# Patient Record
Sex: Male | Born: 1965
Health system: Southern US, Community
[De-identification: ages and names within clinical notes are randomized; demographics above are authoritative.]

## PROBLEM LIST (undated history)

## (undated) ENCOUNTER — Emergency Department (HOSPITAL_BASED_OUTPATIENT_CLINIC_OR_DEPARTMENT_OTHER): Payer: Managed Care, Other (non HMO) | Source: Home / Self Care

## (undated) DIAGNOSIS — R7303 Prediabetes: Secondary | ICD-10-CM

## (undated) DIAGNOSIS — Z86010 Personal history of colonic polyps: Secondary | ICD-10-CM

## (undated) DIAGNOSIS — I10 Essential (primary) hypertension: Secondary | ICD-10-CM

## (undated) DIAGNOSIS — F329 Major depressive disorder, single episode, unspecified: Secondary | ICD-10-CM

## (undated) DIAGNOSIS — K279 Peptic ulcer, site unspecified, unspecified as acute or chronic, without hemorrhage or perforation: Secondary | ICD-10-CM

## (undated) DIAGNOSIS — F3130 Bipolar disorder, current episode depressed, mild or moderate severity, unspecified: Secondary | ICD-10-CM

## (undated) DIAGNOSIS — K219 Gastro-esophageal reflux disease without esophagitis: Secondary | ICD-10-CM

## (undated) DIAGNOSIS — Z860101 Personal history of adenomatous and serrated colon polyps: Secondary | ICD-10-CM

## (undated) DIAGNOSIS — I639 Cerebral infarction, unspecified: Secondary | ICD-10-CM

## (undated) DIAGNOSIS — F411 Generalized anxiety disorder: Secondary | ICD-10-CM

## (undated) DIAGNOSIS — I63532 Cerebral infarction due to unspecified occlusion or stenosis of left posterior cerebral artery: Secondary | ICD-10-CM

## (undated) HISTORY — DX: Cerebral infarction due to unspecified occlusion or stenosis of left posterior cerebral artery: I63.532

## (undated) HISTORY — DX: Essential (primary) hypertension: I10

## (undated) HISTORY — DX: Bipolar disorder, current episode depressed, mild or moderate severity, unspecified: F31.30

## (undated) HISTORY — DX: Major depressive disorder, single episode, unspecified: F32.9

## (undated) HISTORY — DX: Prediabetes: R73.03

## (undated) HISTORY — DX: Cerebral infarction, unspecified: I63.9

## (undated) HISTORY — DX: Personal history of colonic polyps: Z86.010

## (undated) HISTORY — DX: Generalized anxiety disorder: F41.1

## (undated) HISTORY — DX: Personal history of adenomatous and serrated colon polyps: Z86.0101

## (undated) HISTORY — DX: Peptic ulcer, site unspecified, unspecified as acute or chronic, without hemorrhage or perforation: K27.9

## (undated) HISTORY — DX: Gastro-esophageal reflux disease without esophagitis: K21.9

---
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
## 1998-05-24 HISTORY — PX: FLEXIBLE SIGMOIDOSCOPY: SHX1649

## 2010-05-24 HISTORY — PX: VASECTOMY: SHX75

## 2010-05-24 HISTORY — PX: ESOPHAGOGASTRODUODENOSCOPY: SHX1529

## 2010-09-21 ENCOUNTER — Other Ambulatory Visit: Payer: Self-pay | Admitting: Gastroenterology

## 2010-09-25 ENCOUNTER — Ambulatory Visit
Admission: RE | Admit: 2010-09-25 | Discharge: 2010-09-25 | Disposition: A | Discharge status: Home / Self Care | Payer: Managed Care, Other (non HMO) | Source: Ambulatory Visit | Attending: Gastroenterology | Admitting: Gastroenterology

## 2010-09-25 ENCOUNTER — Other Ambulatory Visit: Payer: Self-pay

## 2011-02-23 ENCOUNTER — Other Ambulatory Visit: Payer: Self-pay | Admitting: Gastroenterology

## 2011-02-23 ENCOUNTER — Ambulatory Visit (HOSPITAL_COMMUNITY)
Admission: RE | Admit: 2011-02-23 | Discharge: 2011-02-23 | Disposition: A | Discharge status: Home / Self Care | Payer: Managed Care, Other (non HMO) | Source: Ambulatory Visit | Attending: Gastroenterology | Admitting: Gastroenterology

## 2011-02-23 DIAGNOSIS — R1310 Dysphagia, unspecified: Secondary | ICD-10-CM | POA: Insufficient documentation

## 2011-02-23 DIAGNOSIS — K222 Esophageal obstruction: Secondary | ICD-10-CM | POA: Insufficient documentation

## 2011-02-23 DIAGNOSIS — K219 Gastro-esophageal reflux disease without esophagitis: Secondary | ICD-10-CM | POA: Insufficient documentation

## 2011-11-02 ENCOUNTER — Other Ambulatory Visit: Payer: Self-pay | Admitting: Family Medicine

## 2011-11-02 ENCOUNTER — Ambulatory Visit
Admission: RE | Admit: 2011-11-02 | Discharge: 2011-11-02 | Disposition: A | Discharge status: Home / Self Care | Payer: Managed Care, Other (non HMO) | Source: Ambulatory Visit | Attending: Family Medicine | Admitting: Family Medicine

## 2011-11-02 DIAGNOSIS — M25569 Pain in unspecified knee: Secondary | ICD-10-CM

## 2013-12-22 ENCOUNTER — Encounter: Payer: Self-pay | Admitting: *Deleted

## 2016-07-09 DIAGNOSIS — Z131 Encounter for screening for diabetes mellitus: Secondary | ICD-10-CM | POA: Diagnosis not present

## 2016-07-09 DIAGNOSIS — Z0000 Encounter for general adult medical examination without abnormal findings: Secondary | ICD-10-CM | POA: Diagnosis not present

## 2016-07-09 DIAGNOSIS — Z13220 Encounter for screening for lipoid disorders: Secondary | ICD-10-CM | POA: Diagnosis not present

## 2016-07-09 DIAGNOSIS — R079 Chest pain, unspecified: Secondary | ICD-10-CM | POA: Diagnosis not present

## 2017-01-04 DIAGNOSIS — R0602 Shortness of breath: Secondary | ICD-10-CM | POA: Diagnosis not present

## 2017-07-13 DIAGNOSIS — Z79899 Other long term (current) drug therapy: Secondary | ICD-10-CM | POA: Diagnosis not present

## 2017-07-13 DIAGNOSIS — Z1329 Encounter for screening for other suspected endocrine disorder: Secondary | ICD-10-CM | POA: Diagnosis not present

## 2017-07-13 DIAGNOSIS — K219 Gastro-esophageal reflux disease without esophagitis: Secondary | ICD-10-CM | POA: Diagnosis not present

## 2017-07-13 DIAGNOSIS — J3089 Other allergic rhinitis: Secondary | ICD-10-CM | POA: Diagnosis not present

## 2017-07-13 DIAGNOSIS — Z13220 Encounter for screening for lipoid disorders: Secondary | ICD-10-CM | POA: Diagnosis not present

## 2017-07-13 DIAGNOSIS — Z0000 Encounter for general adult medical examination without abnormal findings: Secondary | ICD-10-CM | POA: Diagnosis not present

## 2017-07-13 DIAGNOSIS — M19042 Primary osteoarthritis, left hand: Secondary | ICD-10-CM | POA: Diagnosis not present

## 2017-09-26 DIAGNOSIS — R200 Anesthesia of skin: Secondary | ICD-10-CM | POA: Diagnosis not present

## 2018-01-17 DIAGNOSIS — R200 Anesthesia of skin: Secondary | ICD-10-CM | POA: Diagnosis not present

## 2018-02-15 ENCOUNTER — Ambulatory Visit: Payer: 59 | Admitting: Neurology

## 2018-02-15 ENCOUNTER — Encounter: Payer: Self-pay | Admitting: Neurology

## 2018-02-15 ENCOUNTER — Ambulatory Visit (INDEPENDENT_AMBULATORY_CARE_PROVIDER_SITE_OTHER): Payer: 59 | Admitting: Neurology

## 2018-02-15 DIAGNOSIS — R202 Paresthesia of skin: Secondary | ICD-10-CM

## 2018-02-15 NOTE — Progress Notes (Signed)
Please refer to EMG and nerve conduction procedure note.  

## 2018-02-15 NOTE — Procedures (Signed)
     HISTORY:  Randall EvenerRichard Cortez is a 52 year old gentleman with a history of migratory sensory alterations that mainly involving the left anterolateral aspect of the thigh, he may have some occasional episodes involving the posterior upper arm on the right.  The episodes will last 5 to 10 minutes and fully clear, most of the time he feels normal, there is no history of neck pain or low back pain or any weakness.  NERVE CONDUCTION STUDIES:  Nerve conduction studies were performed on the right upper extremity. The distal motor latencies and motor amplitudes for the median, radial and ulnar nerves were within normal limits. The nerve conduction velocities for these nerves were also normal. The sensory latencies for the median, radial, and ulnar nerves were normal. The F wave latency for the ulnar nerve was within normal limits.  Nerve conduction studies were performed on the left lower extremity. The distal motor latencies and motor amplitudes for the peroneal and posterior tibial nerves were within normal limits. The nerve conduction velocities for these nerves were also normal. The sensory latencies for the peroneal and sural nerves were within normal limits. The F wave latency for the posterior tibial nerve was within normal limits.   EMG STUDIES:  EMG study was performed on the left lower extremity:  The tibialis anterior muscle reveals 2 to 4K motor units with full recruitment. No fibrillations or positive waves were seen. The peroneus tertius muscle reveals 2 to 4K motor units with full recruitment. No fibrillations or positive waves were seen. The medial gastrocnemius muscle reveals 1 to 3K motor units with full recruitment. No fibrillations or positive waves were seen. The vastus lateralis muscle reveals 2 to 4K motor units with full recruitment. No fibrillations or positive waves were seen. The iliopsoas muscle reveals 2 to 4K motor units with full recruitment. No fibrillations or positive  waves were seen. The biceps femoris muscle (long head) reveals 2 to 4K motor units with full recruitment. No fibrillations or positive waves were seen. The lumbosacral paraspinal muscles were tested at 3 levels, and revealed no abnormalities of insertional activity at all 3 levels tested. There was poor relaxation.   IMPRESSION:  Nerve conduction studies involving the right upper extremity and left lower extremity were within normal limits.  No evidence of a neuropathy is seen.  EMG evaluation of the left lower extremity is unremarkable, no evidence of a lumbosacral radiculopathy is seen.  Randall Cortez. Randall Graydon Fofana MD 02/15/2018 3:00 PM  [facility] [address] [place], [place] [id number]  Phone [phone] Fax [phone]

## 2018-07-15 DIAGNOSIS — R05 Cough: Secondary | ICD-10-CM | POA: Diagnosis not present

## 2018-07-15 DIAGNOSIS — R0982 Postnasal drip: Secondary | ICD-10-CM | POA: Diagnosis not present

## 2018-07-20 DIAGNOSIS — N509 Disorder of male genital organs, unspecified: Secondary | ICD-10-CM | POA: Diagnosis not present

## 2018-07-20 DIAGNOSIS — Z0000 Encounter for general adult medical examination without abnormal findings: Secondary | ICD-10-CM | POA: Diagnosis not present

## 2018-07-20 DIAGNOSIS — J40 Bronchitis, not specified as acute or chronic: Secondary | ICD-10-CM | POA: Diagnosis not present

## 2018-07-21 DIAGNOSIS — Z0000 Encounter for general adult medical examination without abnormal findings: Secondary | ICD-10-CM | POA: Diagnosis not present

## 2018-07-21 DIAGNOSIS — Z13220 Encounter for screening for lipoid disorders: Secondary | ICD-10-CM | POA: Diagnosis not present

## 2018-08-03 DIAGNOSIS — J069 Acute upper respiratory infection, unspecified: Secondary | ICD-10-CM | POA: Diagnosis not present

## 2019-07-03 NOTE — Progress Notes (Signed)
Cardiology Office Note:    Date:  07/05/2019   ID:  Randall Cortez, DOB 1966-01-24, MRN [id number]  PCP:  Patient, No Pcp Per  Cardiologist:  No primary care provider on file.  Electrophysiologist:  None   Referring MD: Carolee Rota, NP   Reason for visit: chest pain and SOB  History of Present Illness:    Randall Cortez is a 54 y.o. male with a hx of acute ischemic left-sided posterior circulation stroke, hypertension, major depressive disorder and bipolar disorder, hyperlipidemia who is working for [employer] [place] and is a very pleasant patient coming for evaluation of chest pain that he experienced last week.  The patient states that prior to Covid he has been very active going to gym 5 times a week and was running on a treadmill for about an hour.  With that he was completely asymptomatic.  With gym closures he stopped exercising gained about 30 pounds and has noticed mild dyspnea on exertion.  He developed pressure-like chest pain last week while he was at rest, that was not associated with dyspnea dizziness or syncope.  He otherwise denies any orthopnea proximal nocturnal dyspnea no lower extremity edema.  The patient has never smoked, he has a family history of premature coronary artery disease in his mom who is a Marine scientist and had a myocardial infarction in her 107s.  She is still alive.  Past Medical History:  Diagnosis Date  . Acute ischemic multifocal posterior circulation stroke involving left-sided vessel ([place])   . Bipolar disorder current episode depressed ([place])   . Cryptogenic stroke ([place])   . GAD (generalized anxiety disorder)   . GERD (gastroesophageal reflux disease)   . History of adenomatous polyp of colon   . HTN (hypertension)   . MDD (major depressive disorder)   . Peptic ulcer   . Prediabetes     Past Surgical History:  Procedure Laterality Date  . APPENDECTOMY  1984  . ESOPHAGOGASTRODUODENOSCOPY  2012   Balloon  . FLEXIBLE SIGMOIDOSCOPY  2000   EDG in  [place]  . sweat gland  1985   I & D on right axillae   . VASECTOMY  2012    Current Medications: Current Meds  Medication Sig  . albuterol (VENTOLIN HFA) 108 (90 Base) MCG/ACT inhaler Inhale 2 puffs into the lungs every 4 (four) hours as needed.  Marland Kitchen atorvastatin (LIPITOR) 10 MG tablet Take 10 mg by mouth daily.  . Multiple Vitamin (MULTIVITAMIN) capsule Take 1 capsule by mouth daily.  . pantoprazole (PROTONIX) 20 MG tablet Take 20 mg by mouth daily.     Allergies:   Patient has no known allergies.   Social History   Socioeconomic History  . Marital status: Married    Spouse name: Not on file  . Number of children: Not on file  . Years of education: Not on file  . Highest education level: Not on file  Occupational History  . Not on file  Tobacco Use  . Smoking status: Never Smoker  . Smokeless tobacco: Never Used  Substance and Sexual Activity  . Alcohol use: Yes    Comment: 4 glasses of wine per month  . Drug use: No  . Sexual activity: Not on file  Other Topics Concern  . Not on file  Social History Narrative   Tobacco Use Cigarettes: Never Smoked   Alcohol: Yes, 4 glasses of wine per month   No recreational drug Use   Diet: Eating better now, not as  much fast food   Exercise: None   Occupation: Employed, Theme park manager   Marital Status: Married   Children: 3   Firearms: No   Therapist, art Use: Yes   Automotive engineer use: Yes         Social Determinants of Radio broadcast assistant Strain:   . Difficulty of Paying Living Expenses: Not on file  Food Insecurity:   . Worried About Charity fundraiser in the Last Year: Not on file  . Ran Out of Food in the Last Year: Not on file  Transportation Needs:   . Lack of Transportation (Medical): Not on file  . Lack of Transportation (Non-Medical): Not on file  Physical Activity:   . Days of Exercise per Week: Not on file  . Minutes of Exercise per Session: Not on file  Stress:   . Feeling of  Stress : Not on file  Social Connections:   . Frequency of Communication with Friends and Family: Not on file  . Frequency of Social Gatherings with Friends and Family: Not on file  . Attends Religious Services: Not on file  . Active Member of Clubs or Organizations: Not on file  . Attends Archivist Meetings: Not on file  . Marital Status: Not on file     Family History: The patient's family history includes CVA in his maternal grandmother; Diabetes Mellitus I in his paternal grandmother; Glaucoma in his mother; Heart attack (age of onset: 96) in his mother.  ROS:   Please see the history of present illness.    All other systems reviewed and are negative.  EKGs/Labs/Other Studies Reviewed:    The following studies were reviewed today:  EKG:  EKG is ordered today.  The ekg ordered today demonstrates normal sinus rhythm, LVH, otherwise normal EKG.  Recent Labs: No results found for requested labs within last 8760 hours.  Recent Lipid Panel No results found for: CHOL, TRIG, HDL, CHOLHDL, VLDL, LDLCALC, LDLDIRECT  Physical Exam:    VS:  BP 126/84   Pulse 61   Ht 6' 1" (1.854 m)   Wt 252 lb 6.4 oz (114.5 kg)   SpO2 96%   BMI 33.30 kg/m     Wt Readings from Last 3 Encounters:  07/05/19 252 lb 6.4 oz (114.5 kg)    GEN: Well nourished, well developed in no acute distress HEENT: Normal NECK: No JVD; No carotid bruits LYMPHATICS: No lymphadenopathy CARDIAC: RRR, no murmurs, rubs, gallops RESPIRATORY:  Clear to auscultation without rales, wheezing or rhonchi  ABDOMEN: Soft, non-tender, non-distended MUSCULOSKELETAL:  No edema; No deformity  SKIN: Warm and dry NEUROLOGIC:  Alert and oriented x 3 PSYCHIATRIC:  Normal affect    ASSESSMENT:    1. Hyperlipidemia, unspecified hyperlipidemia type   2. Precordial pain   3. Morbid obesity ([place])    PLAN:    In order of problems listed above:  1. Chest pain, with some typical and atypical features however  significant risk factors including hyperlipidemia, hypertension, prior history of stroke, family history of premature coronary artery disease, current physical inactivity and obesity.  Will obtain coronary CTA to further evaluate and then tailor his therapy based on his results.   Medication Adjustments/Labs and Tests Ordered: Current medicines are reviewed at length with the patient today.  Concerns regarding medicines are outlined above.  Orders Placed This Encounter  Procedures  . CT CORONARY MORPH W/CTA COR W/SCORE W/CA W/CM &/OR WO/CM  . CT CORONARY FRACTIONAL FLOW  RESERVE DATA PREP  . CT CORONARY FRACTIONAL FLOW RESERVE FLUID ANALYSIS  . Comp Met (CMET)  . Lipid Profile  . EKG 12-Lead   No orders of the defined types were placed in this encounter.   Patient Instructions  Medication Instructions:   Your physician recommends that you continue on your current medications as directed. Please refer to the Current Medication list given to you today.  *If you need a refill on your cardiac medications before your next appointment, please call your pharmacy*   Lab Work:  TODAY--CMET AND LIPIDS  If you have labs (blood work) drawn today and your tests are completely normal, you will receive your results only by: Marland Kitchen MyChart Message (if you have MyChart) OR . A paper copy in the mail If you have any lab test that is abnormal or we need to change your treatment, we will call you to review the results.   Testing/Procedures:  Your cardiac CT will be scheduled at one of the below locations:   [facility] [address] [place], [place] [address] [phone]  If scheduled at [facility], please arrive at the [facility] main entrance of [facility] 30 minutes prior to test start time. Proceed to the [facility] Radiology Department (first floor) to check-in and test prep.  Please follow these instructions carefully (unless otherwise directed):  On  the Night Before the Test: . Be sure to Drink plenty of water. . Do not consume any caffeinated/decaffeinated beverages or chocolate 12 hours prior to your test. . Do not take any antihistamines 12 hours prior to your test.  On the Day of the Test: . Drink plenty of water. Do not drink any water within one hour of the test. . Do not eat any food 4 hours prior to the test. . You may take your regular medications prior to the test.   After the Test: . Drink plenty of water. . After receiving IV contrast, you may experience a mild flushed feeling. This is normal. . On occasion, you may experience a mild rash up to 24 hours after the test. This is not dangerous. If this occurs, you can take Benadryl 25 mg and increase your fluid intake. . If you experience trouble breathing, this can be serious. If it is severe call 911 IMMEDIATELY. If it is mild, please call our office.  Once we have confirmed authorization from your insurance company, we will call you to set up a date and time for your test.   For non-scheduling related questions, please contact the cardiac imaging nurse navigator should you have any questions/concerns: Marchia Bond, RN Navigator Cardiac Imaging [facility] [phone] mobile    Follow-Up: At [facility], you and your health needs are our priority.  As part of our continuing mission to provide you with exceptional heart care, we have created designated Provider Care Teams.  These Care Teams include your primary Cardiologist (physician) and Advanced Practice Providers (APPs -  Physician Assistants and Nurse Practitioners) who all work together to provide you with the care you need, when you need it.  Your next appointment:   3 month(s)  The format for your next appointment:   In Person  Provider:   Ena Dawley, MD       Signed, Ena Dawley, MD  07/05/2019 11:59 AM    [place]

## 2019-07-05 ENCOUNTER — Ambulatory Visit: Payer: 59 | Admitting: Cardiology

## 2019-07-05 ENCOUNTER — Encounter: Payer: Self-pay | Admitting: Cardiology

## 2019-07-05 ENCOUNTER — Other Ambulatory Visit: Payer: Self-pay

## 2019-07-05 VITALS — BP 126/84 | HR 61 | Ht 73.0 in | Wt 252.4 lb

## 2019-07-05 DIAGNOSIS — R072 Precordial pain: Secondary | ICD-10-CM | POA: Diagnosis not present

## 2019-07-05 DIAGNOSIS — E785 Hyperlipidemia, unspecified: Secondary | ICD-10-CM

## 2019-07-05 NOTE — Patient Instructions (Signed)
Medication Instructions:   Your physician recommends that you continue on your current medications as directed. Please refer to the Current Medication list given to you today.  *If you need a refill on your cardiac medications before your next appointment, please call your pharmacy*   Lab Work:  TODAY--CMET AND LIPIDS  If you have labs (blood work) drawn today and your tests are completely normal, you will receive your results only by: Marland Kitchen MyChart Message (if you have MyChart) OR . A paper copy in the mail If you have any lab test that is abnormal or we need to change your treatment, we will call you to review the results.   Testing/Procedures:  Your cardiac CT will be scheduled at one of the below locations:   [facility] [address] [place], [place] [address] [phone]  If scheduled at [facility], please arrive at the [facility] main entrance of [facility] 30 minutes prior to test start time. Proceed to the [facility] Radiology Department (first floor) to check-in and test prep.  Please follow these instructions carefully (unless otherwise directed):  On the Night Before the Test: . Be sure to Drink plenty of water. . Do not consume any caffeinated/decaffeinated beverages or chocolate 12 hours prior to your test. . Do not take any antihistamines 12 hours prior to your test.  On the Day of the Test: . Drink plenty of water. Do not drink any water within one hour of the test. . Do not eat any food 4 hours prior to the test. . You may take your regular medications prior to the test.   After the Test: . Drink plenty of water. . After receiving IV contrast, you may experience a mild flushed feeling. This is normal. . On occasion, you may experience a mild rash up to 24 hours after the test. This is not dangerous. If this occurs, you can take Benadryl 25 mg and increase your fluid intake. . If you experience trouble breathing, this can be  serious. If it is severe call 911 IMMEDIATELY. If it is mild, please call our office.  Once we have confirmed authorization from your insurance company, we will call you to set up a date and time for your test.   For non-scheduling related questions, please contact the cardiac imaging nurse navigator should you have any questions/concerns: Rockwell Alexandria, RN Navigator Cardiac Imaging [facility] [phone] mobile    Follow-Up: At [facility], you and your health needs are our priority.  As part of our continuing mission to provide you with exceptional heart care, we have created designated Provider Care Teams.  These Care Teams include your primary Cardiologist (physician) and Advanced Practice Providers (APPs -  Physician Assistants and Nurse Practitioners) who all work together to provide you with the care you need, when you need it.  Your next appointment:   3 month(s)  The format for your next appointment:   In Person  Provider:   Tobias Alexander, MD

## 2019-07-06 LAB — LIPID PANEL
Chol/HDL Ratio: 4.1 ratio (ref 0.0–5.0)
Cholesterol, Total: 153 mg/dL (ref 100–199)
HDL: 37 mg/dL — ABNORMAL LOW (ref >39)
LDL Chol Calc (NIH): 81 mg/dL (ref 0–99)
Triglycerides: 211 mg/dL — ABNORMAL HIGH (ref 0–149)
VLDL Cholesterol Cal: 35 mg/dL (ref 5–40)

## 2019-07-06 LAB — COMPREHENSIVE METABOLIC PANEL
ALT: 22 IU/L (ref 0–44)
AST: 17 IU/L (ref 0–40)
Albumin/Globulin Ratio: 1.5 (ref 1.2–2.2)
Albumin: 4.4 g/dL (ref 3.8–4.9)
Alkaline Phosphatase: 90 IU/L (ref 39–117)
BUN/Creatinine Ratio: 10 (ref 9–20)
BUN: 13 mg/dL (ref 6–24)
Bilirubin Total: 0.5 mg/dL (ref 0.0–1.2)
CO2: 25 mmol/L (ref 20–29)
Calcium: 9.6 mg/dL (ref 8.7–10.2)
Chloride: 104 mmol/L (ref 96–106)
Creatinine, Ser: 1.33 mg/dL — ABNORMAL HIGH (ref 0.76–1.27)
GFR calc Af Amer: 71 mL/min/{1.73_m2} (ref >59)
GFR calc non Af Amer: 61 mL/min/{1.73_m2} (ref >59)
Globulin, Total: 3 g/dL (ref 1.5–4.5)
Glucose: 82 mg/dL (ref 65–99)
Potassium: 4.5 mmol/L (ref 3.5–5.2)
Sodium: 142 mmol/L (ref 134–144)
Total Protein: 7.4 g/dL (ref 6.0–8.5)

## 2019-07-11 ENCOUNTER — Telehealth: Payer: Self-pay | Admitting: *Deleted

## 2019-07-11 MED ORDER — FISH OIL 1000 MG PO CAPS: 1000.0000 mg | ORAL_CAPSULE | Freq: Two times a day (BID) | ORAL | 1 refills | Status: DC

## 2019-07-11 MED ORDER — ATORVASTATIN CALCIUM 20 MG PO TABS: 20.0000 mg | ORAL_TABLET | Freq: Every day | ORAL | 1 refills | Status: DC

## 2019-07-11 NOTE — Telephone Encounter (Signed)
-----   Message from Lars Masson, MD sent at 07/06/2019 10:04 PM EST ----- Elevated TG, I would add fish oil 1 g PO BID, increase atorvastatin to 20 mg po daily

## 2019-07-11 NOTE — Telephone Encounter (Signed)
Spoke with the pt and informed him that per Dr. Delton See, his labs showed elevated TG, and she recommends that we add fish oil 1 gram po bid, and increase his atorvastatin to 20 mg po daily.  Confirmed the pharmacy of choice with the pt.  Pt verbalized understanding and agrees with this plan.

## 2019-08-21 ENCOUNTER — Ambulatory Visit (HOSPITAL_COMMUNITY): Payer: 59

## 2019-09-03 ENCOUNTER — Telehealth (HOSPITAL_COMMUNITY): Payer: Self-pay | Admitting: Emergency Medicine

## 2019-09-03 NOTE — Telephone Encounter (Signed)
Attempted to call patient regarding upcoming cardiac CT appointment. °Left message on voicemail with name and callback number °Takenya Travaglini RN Navigator Cardiac Imaging °[facility] °[phone] Office °[phone] Cell ° °

## 2019-09-04 ENCOUNTER — Ambulatory Visit (HOSPITAL_COMMUNITY): Payer: 59

## 2019-09-21 ENCOUNTER — Telehealth (HOSPITAL_COMMUNITY): Payer: Self-pay | Admitting: Emergency Medicine

## 2019-09-21 NOTE — Telephone Encounter (Signed)
Attempted to call patient regarding upcoming cardiac CT appointment. °Left message on voicemail with name and callback number °Julea Hutto RN Navigator Cardiac Imaging °[facility] °[phone] Office °[phone] Cell ° °

## 2019-09-24 ENCOUNTER — Ambulatory Visit (HOSPITAL_COMMUNITY)
Admission: RE | Admit: 2019-09-24 | Discharge: 2019-09-24 | Disposition: A | Discharge status: Home / Self Care | Payer: 59 | Source: Ambulatory Visit | Attending: Cardiology | Admitting: Cardiology

## 2019-09-24 ENCOUNTER — Other Ambulatory Visit: Payer: Self-pay

## 2019-09-24 DIAGNOSIS — E785 Hyperlipidemia, unspecified: Secondary | ICD-10-CM | POA: Diagnosis present

## 2019-09-24 DIAGNOSIS — R072 Precordial pain: Secondary | ICD-10-CM

## 2019-09-24 MED ORDER — IOHEXOL 350 MG/ML SOLN
80.0000 mL | Freq: Once | INTRAVENOUS | Status: AC | PRN
  Administered 2019-09-24: 80 mL via INTRAVENOUS

## 2019-09-24 MED ORDER — NITROGLYCERIN 0.4 MG SL SUBL
SUBLINGUAL_TABLET | SUBLINGUAL | Status: AC
  Administered 2019-09-24: 08:00:00 0.8 mg via SUBLINGUAL
  Filled 2019-09-24: qty 1

## 2019-09-24 MED ORDER — NITROGLYCERIN 0.4 MG SL SUBL: 0.8000 mg | SUBLINGUAL_TABLET | Freq: Once | SUBLINGUAL | Status: AC

## 2019-09-25 ENCOUNTER — Telehealth: Payer: Self-pay | Admitting: *Deleted

## 2019-09-25 MED ORDER — ASPIRIN EC 81 MG PO TBEC: 81.0000 mg | DELAYED_RELEASE_TABLET | Freq: Every day | ORAL | 3 refills | Status: DC

## 2019-09-25 NOTE — Telephone Encounter (Signed)
Spoke with the pt and informed him of his Coronary CT results and recommendations per Dr. Delton See, for him to continue his same management, and add ASA 81 mg po daily to his regimen.  Pt verbalized understanding and agrees with this plan.

## 2019-09-25 NOTE — Telephone Encounter (Signed)
-----   Message from Lars Masson, MD sent at 09/25/2019  9:12 AM EDT ----- Minimal nonobstructive CAD, continue the same management, add aspirin 81 mg po daily

## 2019-10-04 ENCOUNTER — Ambulatory Visit: Payer: 59 | Admitting: Cardiology

## 2019-10-04 ENCOUNTER — Other Ambulatory Visit: Payer: Self-pay

## 2019-10-04 ENCOUNTER — Encounter: Payer: Self-pay | Admitting: Cardiology

## 2019-10-04 VITALS — BP 112/66 | HR 57 | Ht 73.0 in | Wt 249.0 lb

## 2019-10-04 DIAGNOSIS — E785 Hyperlipidemia, unspecified: Secondary | ICD-10-CM

## 2019-10-04 DIAGNOSIS — I2510 Atherosclerotic heart disease of native coronary artery without angina pectoris: Secondary | ICD-10-CM | POA: Diagnosis not present

## 2019-10-04 DIAGNOSIS — Z8249 Family history of ischemic heart disease and other diseases of the circulatory system: Secondary | ICD-10-CM | POA: Diagnosis not present

## 2019-10-04 DIAGNOSIS — R072 Precordial pain: Secondary | ICD-10-CM | POA: Diagnosis not present

## 2019-10-04 NOTE — Progress Notes (Signed)
Cardiology Office Note:    Date:  10/04/2019   ID:  Randall Cortez, DOB 1966-02-27, MRN [id number]  PCP:  Patient, No Pcp Per  Cardiologist:  No primary care provider on file.  Electrophysiologist:  None   Referring MD: No ref. provider found   Reason for visit: follow up for chest pain and SOB  History of Present Illness:    Randall Cortez is a 54 y.o. male with a hx of acute ischemic left-sided posterior circulation stroke, hypertension, major depressive disorder and bipolar disorder, hyperlipidemia who is working for [employer] [place] and is a very pleasant patient coming for evaluation of chest pain that he experienced last week.  The patient states that prior to Covid he has been very active going to gym 5 times a week and was running on a treadmill for about an hour.  With that he was completely asymptomatic.  With gym closures he stopped exercising gained about 30 pounds and has noticed mild dyspnea on exertion.  He developed pressure-like chest pain last week while he was at rest, that was not associated with dyspnea dizziness or syncope.  He otherwise denies any orthopnea proximal nocturnal dyspnea no lower extremity edema.  The patient has never smoked, he has a family history of premature coronary artery disease in his mom who is a Engineer, civil (consulting) and had a myocardial infarction in her 75s.  She is still alive.  10/04/2019 -the patient is coming for follow-up, he underwent coronary CTA earlier this month and it showed mild nonobstructive CAD predominantly in distal left main and proximal LAD.  The patient was started on moderate dose of atorvastatin 20 mg daily as well as fish oil.  He tolerates those well.  He is motivated to walk, he used to run 5 miles a day prior to [employer].  He admits that his diet is not good but is motivated to change it.   Past Medical History:  Diagnosis Date  . Acute ischemic multifocal posterior circulation stroke involving left-sided vessel (HCC)   . Bipolar  disorder current episode depressed (HCC)   . Cryptogenic stroke (HCC)   . GAD (generalized anxiety disorder)   . GERD (gastroesophageal reflux disease)   . History of adenomatous polyp of colon   . HTN (hypertension)   . MDD (major depressive disorder)   . Peptic ulcer   . Prediabetes     Past Surgical History:  Procedure Laterality Date  . APPENDECTOMY  1984  . ESOPHAGOGASTRODUODENOSCOPY  2012   Balloon  . FLEXIBLE SIGMOIDOSCOPY  2000   EDG in [place]  . sweat gland  1985   I & D on right axillae   . VASECTOMY  2012    Current Medications: Current Meds  Medication Sig  . albuterol (VENTOLIN HFA) 108 (90 Base) MCG/ACT inhaler Inhale 2 puffs into the lungs every 4 (four) hours as needed.  Marland Kitchen aspirin EC 81 MG tablet Take 1 tablet (81 mg total) by mouth daily.  Marland Kitchen atorvastatin (LIPITOR) 20 MG tablet Take 1 tablet (20 mg total) by mouth daily.  . Multiple Vitamin (MULTIVITAMIN) capsule Take 1 capsule by mouth daily.  . Omega-3 Fatty Acids (FISH OIL) 1000 MG CAPS Take 1 capsule (1,000 mg total) by mouth 2 (two) times daily.  . pantoprazole (PROTONIX) 20 MG tablet Take 20 mg by mouth daily.     Allergies:   Patient has no known allergies.   Social History   Socioeconomic History  . Marital status: Married  Spouse name: Not on file  . Number of children: Not on file  . Years of education: Not on file  . Highest education level: Not on file  Occupational History  . Not on file  Tobacco Use  . Smoking status: Never Smoker  . Smokeless tobacco: Never Used  Substance and Sexual Activity  . Alcohol use: Yes    Comment: 4 glasses of wine per month  . Drug use: No  . Sexual activity: Not on file  Other Topics Concern  . Not on file  Social History Narrative   Tobacco Use Cigarettes: Never Smoked   Alcohol: Yes, 4 glasses of wine per month   No recreational drug Use   Diet: Eating better now, not as much fast food   Exercise: None   Occupation: Employed, Health visitor   Marital Status: Married   Children: 3   Firearms: No   Risk analyst Use: Yes   Advice worker use: Yes         Social Determinants of Corporate investment banker Strain:   . Difficulty of Paying Living Expenses:   Food Insecurity:   . Worried About Programme researcher, broadcasting/film/video in the Last Year:   . Barista in the Last Year:   Transportation Needs:   . Freight forwarder (Medical):   Marland Kitchen Lack of Transportation (Non-Medical):   Physical Activity:   . Days of Exercise per Week:   . Minutes of Exercise per Session:   Stress:   . Feeling of Stress :   Social Connections:   . Frequency of Communication with Friends and Family:   . Frequency of Social Gatherings with Friends and Family:   . Attends Religious Services:   . Active Member of Clubs or Organizations:   . Attends Banker Meetings:   Marland Kitchen Marital Status:     Family History: The patient's family history includes CVA in his maternal grandmother; Diabetes Mellitus I in his paternal grandmother; Glaucoma in his mother; Heart attack (age of onset: 57) in his mother.  ROS:   Please see the history of present illness.    All other systems reviewed and are negative.  EKGs/Labs/Other Studies Reviewed:    The following studies were reviewed today:  EKG:  EKG is ordered today.  The ekg ordered today demonstrates normal sinus rhythm, LVH, otherwise normal EKG.  Recent Labs: 07/05/2019: ALT 22; BUN 13; Creatinine, Ser 1.33; Potassium 4.5; Sodium 142  Recent Lipid Panel    Component Value Date/Time   CHOL 153 07/05/2019 1154   TRIG 211 (H) 07/05/2019 1154   HDL 37 (L) 07/05/2019 1154   CHOLHDL 4.1 07/05/2019 1154   LDLCALC 81 07/05/2019 1154   Physical Exam:    VS:  BP 112/66   Pulse (!) 57   Ht 6\' 1"  (1.854 m)   Wt 249 lb (112.9 kg)   SpO2 96%   BMI 32.85 kg/m     Wt Readings from Last 3 Encounters:  10/04/19 249 lb (112.9 kg)  07/05/19 252 lb 6.4 oz (114.5 kg)    GEN: Well  nourished, well developed in no acute distress HEENT: Normal NECK: No JVD; No carotid bruits LYMPHATICS: No lymphadenopathy CARDIAC: RRR, no murmurs, rubs, gallops RESPIRATORY:  Clear to auscultation without rales, wheezing or rhonchi  ABDOMEN: Soft, non-tender, non-distended MUSCULOSKELETAL:  No edema; No deformity  SKIN: Warm and dry NEUROLOGIC:  Alert and oriented x 3 PSYCHIATRIC:  Normal affect  ASSESSMENT:    1. Coronary artery disease involving native coronary artery of native heart without angina pectoris   2. Hyperlipidemia, unspecified hyperlipidemia type   3. Precordial pain   4. Family history of early CAD    PLAN:    In order of problems listed above:  1. Mild non-obstructive CAD -aggressive medical management, change of lifestyle specifically diet and increasing exercise.  We will follow repeat lipid profile and if needed refer to the lipid clinic. 2. Hypertension -controlled 3. Hyperlipidemia -he is tolerating atorvastatin and fish oil well, his last lipids were obtained only week after he started them with no changes to his prior diet.  Will repeat.  Medication Adjustments/Labs and Tests Ordered: Current medicines are reviewed at length with the patient today.  Concerns regarding medicines are outlined above.  No orders of the defined types were placed in this encounter.  No orders of the defined types were placed in this encounter.   Patient Instructions  Medication Instructions:   Your physician recommends that you continue on your current medications as directed. Please refer to the Current Medication list given to you today.  *If you need a refill on your cardiac medications before your next appointment, please call your pharmacy*   Follow-Up: At [facility], you and your health needs are our priority.  As part of our continuing mission to provide you with exceptional heart care, we have created designated Provider Care Teams.  These Care Teams  include your primary Cardiologist (physician) and Advanced Practice Providers (APPs -  Physician Assistants and Nurse Practitioners) who all work together to provide you with the care you need, when you need it.  We recommend signing up for the patient portal called "MyChart".  Sign up information is provided on this After Visit Summary.  MyChart is used to connect with patients for Virtual Visits (Telemedicine).  Patients are able to view lab/test results, encounter notes, upcoming appointments, etc.  Non-urgent messages can be sent to your provider as well.   To learn more about what you can do with MyChart, go to [email or web address].    Your next appointment:   12 month(s)  The format for your next appointment:   In Person  Provider:   Ena Dawley, MD        Signed, Ena Dawley, MD  10/04/2019 10:09 AM    [place]

## 2019-10-04 NOTE — Patient Instructions (Signed)
Medication Instructions:  ° °Your physician recommends that you continue on your current medications as directed. Please refer to the Current Medication list given to you today. ° °*If you need a refill on your cardiac medications before your next appointment, please call your pharmacy* ° °Follow-Up: °At [facility], you and your health needs are our priority.  As part of our continuing mission to provide you with exceptional heart care, we have created designated Provider Care Teams.  These Care Teams include your primary Cardiologist (physician) and Advanced Practice Providers (APPs -  Physician Assistants and Nurse Practitioners) who all work together to provide you with the care you need, when you need it. ° °We recommend signing up for the patient portal called "MyChart".  Sign up information is provided on this After Visit Summary.  MyChart is used to connect with patients for Virtual Visits (Telemedicine).  Patients are able to view lab/test results, encounter notes, upcoming appointments, etc.  Non-urgent messages can be sent to your provider as well.   °To learn more about what you can do with MyChart, go to [email or web address].   ° °Your next appointment:   °12 month(s) ° °The format for your next appointment:   °In Person ° °Provider:   °Katarina Nelson, MD ° ° ° ° °

## 2020-01-28 ENCOUNTER — Other Ambulatory Visit: Payer: Self-pay | Admitting: Cardiology

## 2021-08-20 IMAGING — CT CT HEART MORP W/ CTA COR W/ SCORE W/ CA W/CM &/OR W/O CM
4 of 7 series · 8 of 20 positions shown, 9 images · IV contrast (APPLIED)
Comparison: None.
COMPARISON: None.

Addendum:
EXAM:
OVER-READ INTERPRETATION  CT CHEST

The following report is an over-read performed by radiologist Dr.
Anyi Moz [REDACTED] on 09/24/2019. This
over-read does not include interpretation of cardiac or coronary
anatomy or pathology. The coronary calcium score/coronary CTA
interpretation by the cardiologist is attached.
CLINICAL DATA: 53-year-old male with h/o hypertension, CVA and
chest pain.
Cardiac/Coronary  CTA
TECHNIQUE: The patient was scanned on a Phillips Force scanner.

[Series 7: best diast 74 % · axial · 0.45mm/px · z∈[-210,-155]mm · 2 of 412 slices shown, 3 images]
[im 138/412  vessel]
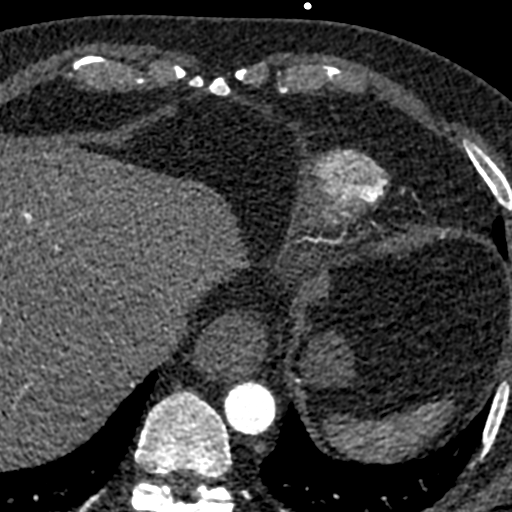
[im 138/412  lung]
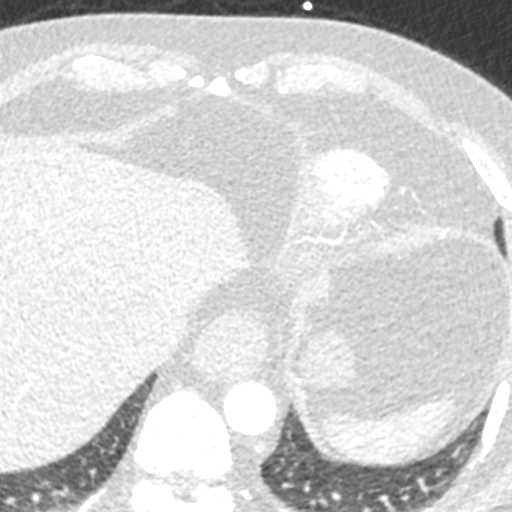
[im 275/412  vessel]
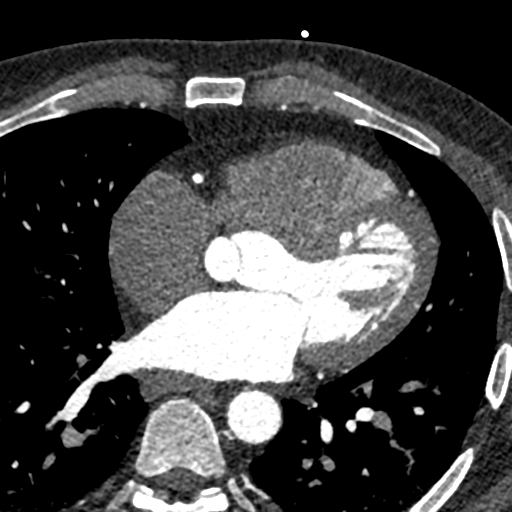

[Series 8: best syst 35 % · axial · 0.45mm/px · z∈[-210,-155]mm · 2 of 412 slices shown]
[im 138/412  vessel]
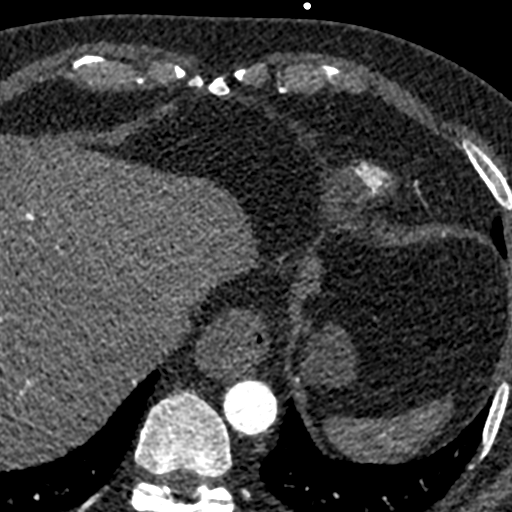
[im 275/412  vessel]
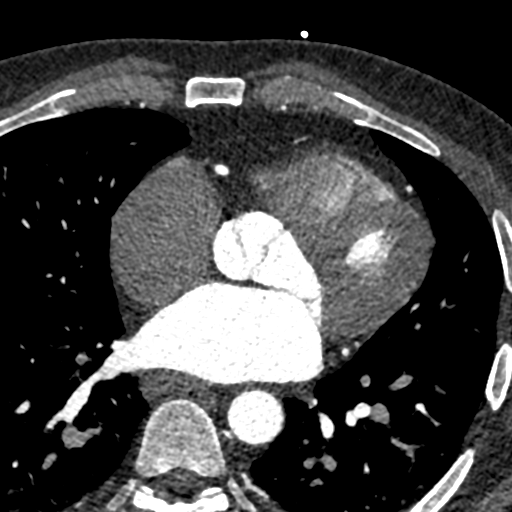

[Series 9: ts diast sharp 35 % · axial · 0.45mm/px · z∈[-210,-155]mm · 2 of 412 slices shown]
[im 138/412  lung]
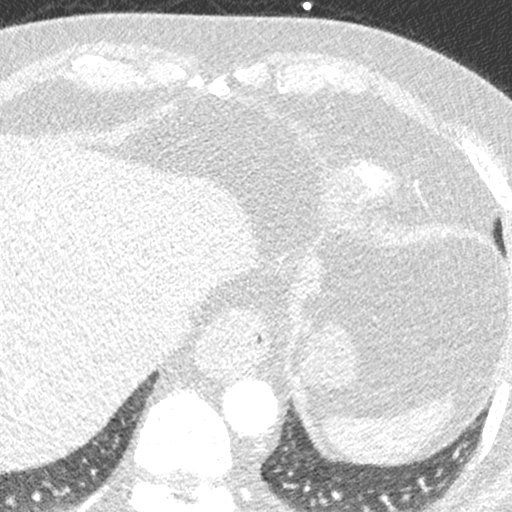
[im 275/412  lung]
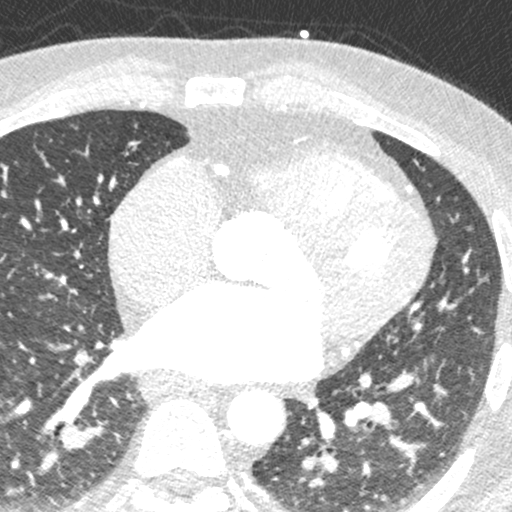

[Series 10: ts syst sharp 35 % · axial · 0.45mm/px · z∈[-210,-155]mm · 2 of 412 slices shown]
[im 138/412  lung]
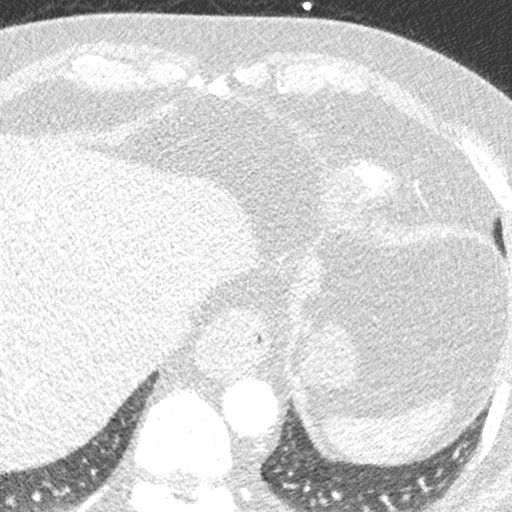
[im 275/412  lung]
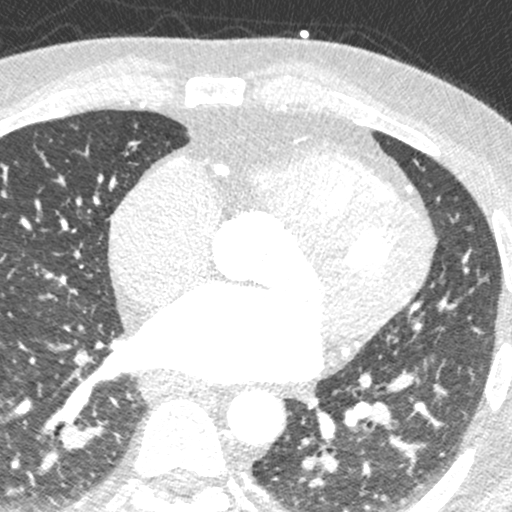

[8 of 20 positions shown; findings below may reference images not displayed]

FINDINGS: Within the visualized portions of the thorax there are no suspicious
appearing pulmonary nodules or masses, there is no acute
consolidative airspace disease, no pleural effusions, no
pneumothorax and no lymphadenopathy. Visualized portions of the
upper abdomen are unremarkable. There are no aggressive appearing
lytic or blastic lesions noted in the visualized portions of the
skeleton.
IMPRESSION: No significant incidental noncardiac findings are noted.
FINDINGS: A 100 kV prospective scan was triggered in the descending thoracic
aorta at 111 HU's. Axial non-contrast 3 mm slices were carried out
through the heart. The data set was analyzed on a dedicated work
station and scored using the Agatson method. Gantry rotation speed
was 250 msecs and collimation was .6 mm. No beta blockade and 0.8 mg
of sl NTG was given. The 3D data set was reconstructed in 5%
intervals of the 67-82 % of the R-R cycle. Diastolic phases were
analyzed on a dedicated work station using MPR, MIP and VRT modes.
The patient received 80 cc of contrast.

Aorta: Normal size. Minimal diffuse atherosclerotic plaque and
calcifications. No dissection.

Aortic Valve:  Trileaflet.  No calcifications.

Coronary Arteries:  Normal coronary origin.  Right dominance.

RCA is a large dominant artery that gives rise to PDA and PLA. There
is no plaque.

Left main is a large artery that gives rise to LAD and LCX arteries.
Distal left main has mild eccentric calcified plaque with stenosis
0-25%.

LAD is a large vessel that gives rise to one large diagonal artery
and has minimal calcified plaque in the proximal portion with
stenosis 0-25%. Mid and distal LAD has only luminal irregularities

D1 is a large artery with only minimal irregularities.

LCX is a non-dominant artery that gives rise to one large OM1
branch. There are minimal irregularities.

Other findings:

Normal pulmonary vein drainage into the left atrium.

Normal left atrial appendage without a thrombus.

Normal size of the pulmonary artery.
IMPRESSION: 1. Coronary calcium score of 61. This was 82 percentile for age and
sex matched control.

2. Normal coronary origin with right dominance.

3. CAD-RADS 1. Minimal non-obstructive CAD (0-24%) in the distal
left main and proximal LAD. Consider non-atherosclerotic causes of
chest pain. Consider preventive therapy and risk factor
modification.

*** End of Addendum ***
EXAM:
OVER-READ INTERPRETATION  CT CHEST

The following report is an over-read performed by radiologist Dr.
Anyi Moz [REDACTED] on 09/24/2019. This
over-read does not include interpretation of cardiac or coronary
anatomy or pathology. The coronary calcium score/coronary CTA
interpretation by the cardiologist is attached.
FINDINGS: Within the visualized portions of the thorax there are no suspicious
appearing pulmonary nodules or masses, there is no acute
consolidative airspace disease, no pleural effusions, no
pneumothorax and no lymphadenopathy. Visualized portions of the
upper abdomen are unremarkable. There are no aggressive appearing
lytic or blastic lesions noted in the visualized portions of the
skeleton.
IMPRESSION: No significant incidental noncardiac findings are noted.

## 2022-05-27 ENCOUNTER — Ambulatory Visit (HOSPITAL_BASED_OUTPATIENT_CLINIC_OR_DEPARTMENT_OTHER)
Admission: RE | Admit: 2022-05-27 | Discharge: 2022-05-27 | Disposition: A | Discharge status: Home / Self Care | Payer: 59 | Source: Ambulatory Visit | Attending: Family Medicine | Admitting: Family Medicine

## 2022-05-27 ENCOUNTER — Other Ambulatory Visit (HOSPITAL_BASED_OUTPATIENT_CLINIC_OR_DEPARTMENT_OTHER): Payer: Self-pay | Admitting: Family Medicine

## 2022-05-27 DIAGNOSIS — R062 Wheezing: Secondary | ICD-10-CM | POA: Insufficient documentation

## 2023-07-19 ENCOUNTER — Ambulatory Visit: Payer: 59

## 2023-07-19 ENCOUNTER — Ambulatory Visit: Payer: 59 | Admitting: Internal Medicine

## 2023-07-19 ENCOUNTER — Encounter: Payer: Self-pay | Admitting: Internal Medicine

## 2023-07-19 VITALS — BP 112/66 | HR 69 | Ht 73.0 in | Wt 263.0 lb

## 2023-07-19 DIAGNOSIS — R0600 Dyspnea, unspecified: Secondary | ICD-10-CM

## 2023-07-19 DIAGNOSIS — J4540 Moderate persistent asthma, uncomplicated: Secondary | ICD-10-CM

## 2023-07-19 DIAGNOSIS — R053 Chronic cough: Secondary | ICD-10-CM

## 2023-07-19 DIAGNOSIS — R062 Wheezing: Secondary | ICD-10-CM

## 2023-07-19 LAB — CBC WITH DIFFERENTIAL/PLATELET
Basophils Absolute: 0.1 10*3/uL (ref 0.0–0.1)
Basophils Relative: 0.6 % (ref 0.0–3.0)
Eosinophils Absolute: 0.6 10*3/uL (ref 0.0–0.7)
Eosinophils Relative: 5.8 % — ABNORMAL HIGH (ref 0.0–5.0)
HCT: 47.6 % (ref 39.0–52.0)
Hemoglobin: 15.9 g/dL (ref 13.0–17.0)
Lymphocytes Relative: 30.8 % (ref 12.0–46.0)
Lymphs Abs: 3.3 10*3/uL (ref 0.7–4.0)
MCHC: 33.4 g/dL (ref 30.0–36.0)
MCV: 86.9 fl (ref 78.0–100.0)
Monocytes Absolute: 0.9 10*3/uL (ref 0.1–1.0)
Monocytes Relative: 8 % (ref 3.0–12.0)
Neutro Abs: 5.9 10*3/uL (ref 1.4–7.7)
Neutrophils Relative %: 54.8 % (ref 43.0–77.0)
Platelets: 370 10*3/uL (ref 150.0–400.0)
RBC: 5.48 Mil/uL (ref 4.22–5.81)
RDW: 13.7 % (ref 11.5–15.5)
WBC: 10.8 10*3/uL — ABNORMAL HIGH (ref 4.0–10.5)

## 2023-07-19 LAB — NITRIC OXIDE: Nitric Oxide: 113

## 2023-07-19 MED ORDER — BREO ELLIPTA 200-25 MCG/ACT IN AEPB: 1.0000 | INHALATION_SPRAY | Freq: Every day | RESPIRATORY_TRACT | 5 refills | Status: DC

## 2023-07-19 NOTE — Patient Instructions (Addendum)
 ICD-10-CM   1. Chronic cough  R05.3 Nitric oxide    2. Dyspnea, unspecified type  R06.00     3. Wheeze  R06.2     4. 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

## 2023-07-19 NOTE — Progress Notes (Signed)
 OV 07/19/2023  Subjective:  Patient ID: Randall Cortez, male , DOB: [birth year] , age 58 y.o. , MRN: [id number] , ADDRESS: [address] [place] [address] PCP Patient, No Pcp Per Patient Care Team: Patient, No Pcp Per as PCP - General (General Practice) Roseanna Rainbow, PA-C (Inactive) as Physician Assistant (Physician Assistant)  This Provider for this visit: Treatment Team:  Attending Provider: Kalman Shan, MD    07/19/2023 -   Chief Complaint  Patient presents with   Pulmonary Consult    Referred by [facility]. Pt c/o cough since August 2024- tx with abx and then got better but worsened again Nov 2024. His cough is non prod. He has occ DOE.      HPI Randall Cortez 58 y.o. 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  Here for new consult evaluation.  He has been on Protonix for at least 20 years for acid reflux.  He has been on fish oil for the last few years taking it 4 times a week.  He used to exercise regularly on a treadmill walking 3 to 4 miles 4 days a week at work in an effort to lose weight.  He was doing until [place].  Then independent of this around August/September 2024 started developing a nagging cough.  He said over Christmas it got really bad he had to use a lot of albuterol which seemed to help a little bit.  No antecedent illness.  Then also around Christmas significant wheezing particularly at night.  Expiratory of wheezing definitely.  He says for the last 1 month at a stop and for the last few weeks the cough is also better.  Things have transition into nonspecific dyspnea where he feels he is not able to have a good lung capacity.  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  Asthma is a consideration.  Has been referred here for further evaluation.  Not tried inhaled steroids.  Chronic cough associated history - On fish oil - Has acid reflux on PPI - Denies any seasonal allergies but is taking [facility] over-the-counter  antihistamine for the last 12-18 months. -Not ACE inhibitors - No chronic sinus drainage. -Last imaging available to [place] in 2021 was clear.   - FENO 113 ppb and PSITIVE  CT Chest data from date:   - personally visualized and independently interpreted : no  OVER-READ INTERPRETATION  CT CHEST   The following report is an over-read performed by radiologist Dr. Trudie Reed of [facility] on 09/24/2019. This over-read does not include interpretation of cardiac or coronary anatomy or pathology. The coronary calcium score/coronary CTA interpretation by the cardiologist is attached.   COMPARISON:  None.   FINDINGS: Within the visualized portions of the thorax there are no suspicious appearing pulmonary nodules or masses, there is no acute consolidative airspace disease, no pleural effusions, no pneumothorax and no lymphadenopathy. Visualized portions of the upper abdomen are unremarkable. There are no aggressive appearing lytic or blastic lesions noted in the visualized portions of the skeleton.   IMPRESSION: No significant incidental noncardiac findings are noted.   Electronically Signed: By: Trudie Reed M.D. On: 09/24/2019 08:39    PFT      No data to display             LAB RESULTS last 96 hours No results found.       has a past medical history of Acute ischemic multifocal posterior circulation stroke involving  left-sided vessel (HCC), Bipolar disorder current episode depressed (HCC), Cryptogenic stroke (HCC), GAD (generalized anxiety disorder), GERD (gastroesophageal reflux disease), History of adenomatous polyp of colon, HTN (hypertension), MDD (major depressive disorder), Peptic ulcer, and Prediabetes.   reports that he has never smoked. He has been exposed to tobacco smoke. He has never used smokeless tobacco.  Past Surgical History:  Procedure Laterality Date   APPENDECTOMY  1984   ESOPHAGOGASTRODUODENOSCOPY  2012   Balloon    FLEXIBLE SIGMOIDOSCOPY  2000   EDG in [place]   sweat gland  1985   I & D on right axillae    VASECTOMY  2012    No Known Allergies   There is no immunization history on file for this patient.  Family History  Problem Relation Age of Onset   Glaucoma Mother    Heart attack Mother 1       AND 13   Lung disease Father        welder   CVA Maternal Grandmother    Diabetes Mellitus I Paternal Grandmother      Current Outpatient Medications:    albuterol (VENTOLIN HFA) 108 (90 Base) MCG/ACT inhaler, Inhale 2 puffs into the lungs every 4 (four) hours as needed., Disp: , Rfl:    atorvastatin (LIPITOR) 20 MG tablet, TAKE 1 TABLET BY MOUTH EVERY DAY, Disp: 30 tablet, Rfl: 8   fluticasone furoate-vilanterol (BREO ELLIPTA) 200-25 MCG/ACT AEPB, Inhale 1 puff into the lungs daily., Disp: 60 each, Rfl: 5   Multiple Vitamin (MULTIVITAMIN) capsule, Take 1 capsule by mouth daily., Disp: , Rfl:    Omega-3 Fatty Acids (FISH OIL) 1000 MG CAPS, Take 1 capsule (1,000 mg total) by mouth 2 (two) times daily., Disp: 180 capsule, Rfl: 1   pantoprazole (PROTONIX) 20 MG tablet, Take 20 mg by mouth daily., Disp: , Rfl:    aspirin EC 81 MG tablet, Take 1 tablet (81 mg total) by mouth daily. (Patient not taking: Reported on 07/19/2023), Disp: 90 tablet, Rfl: 3      Objective:   Vitals:   07/19/23 0909  BP: 112/66  Pulse: 69  SpO2: 96%  Weight: 263 lb (119.3 kg)  Height: 6\' 1"  (1.854 m)    Estimated body mass index is 34.7 kg/m as calculated from the following:   Height as of this encounter: 6\' 1"  (1.854 m).   Weight as of this encounter: 263 lb (119.3 kg).  @WEIGHTCHANGE @  [employer]   07/19/23 0909  Weight: 263 lb (119.3 kg)     Physical Exam   General: No distress. Looks wellno O2 at rest: no Cane present: no Sitting in wheel chair: no Frail: no Obese: YES Neuro: Alert and Oriented x 3. GCS 15. Speech normal Psych: Pleasant Resp:  Barrel Chest - NO .  Wheeze - no,  Crackles - no, No overt respiratory distress CVS: Normal heart sounds. Murmurs - no Ext: Stigmata of Connective Tissue Disease - no HEENT: Normal upper airway. PEERL +. No post nasal drip        Assessment:       ICD-10-CM   1. Chronic cough  R05.3 Nitric oxide    CBC w/Diff    Perennial allergen profile IgE    DG Chest 2 View    Pulmonary function test    2. Dyspnea, unspecified type  R06.00 CBC w/Diff    Perennial allergen profile IgE    DG Chest 2 View    Pulmonary function test    3.  Wheeze  R06.2 CBC w/Diff    Perennial allergen profile IgE    DG Chest 2 View    Pulmonary function test    4. Moderate persistent asthma without complication  J45.40 CBC w/Diff    Perennial allergen profile IgE    DG Chest 2 View    Pulmonary function test         Plan:     Patient Instructions     ICD-10-CM   1. Chronic cough  R05.3 Nitric oxide    2. Dyspnea, unspecified type  R06.00     3. Wheeze  R06.2     4. 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   FOLLOWUP Return in about 7 weeks (around 09/06/2023) for with Dr Marchelle Gearing, Face to Face Visit.    SIGNATURE    Dr. Kalman Shan, M.D., F.C.C.P,  Pulmonary and Critical Care Medicine Staff Physician, [facility] Center Director - Interstitial Lung Disease  Program  [facility] [place], [place], [address]  Pager: [phone], If no answer or between  15:00h - 7:00h: call [phone] Telephone: [phone]  9:34 AM 07/19/2023

## 2023-07-22 LAB — ALLERGEN PROFILE, PERENNIAL ALLERGEN IGE
Alternaria Alternata IgE: <0.1 kU/L
Aspergillus Fumigatus IgE: <0.1 kU/L
Aureobasidi Pullulans IgE: <0.1 kU/L
Candida Albicans IgE: <0.1 kU/L
Cat Dander IgE: <0.1 kU/L
Chicken Feathers IgE: <0.1 kU/L
Cladosporium Herbarum IgE: <0.1 kU/L
Cow Dander IgE: <0.1 kU/L
D Farinae IgE: <0.1 kU/L
D Pteronyssinus IgE: <0.1 kU/L
Dog Dander IgE: 0.21 kU/L — AB
Duck Feathers IgE: <0.1 kU/L
Goose Feathers IgE: <0.1 kU/L
Mouse Urine IgE: <0.1 kU/L
Mucor Racemosus IgE: <0.1 kU/L
Penicillium Chrysogen IgE: <0.1 kU/L
Phoma Betae IgE: <0.1 kU/L
Setomelanomma Rostrat: <0.1 kU/L
Stemphylium Herbarum IgE: <0.1 kU/L

## 2023-10-10 ENCOUNTER — Ambulatory Visit: Payer: 59 | Admitting: Primary Care

## 2023-10-10 ENCOUNTER — Ambulatory Visit: Payer: 59 | Admitting: Internal Medicine

## 2023-10-10 ENCOUNTER — Encounter: Payer: Self-pay | Admitting: Primary Care

## 2023-10-10 VITALS — BP 122/76 | HR 67 | Temp 98.3°F | Ht 73.0 in | Wt 264.0 lb

## 2023-10-10 DIAGNOSIS — R053 Chronic cough: Secondary | ICD-10-CM | POA: Diagnosis not present

## 2023-10-10 DIAGNOSIS — R062 Wheezing: Secondary | ICD-10-CM

## 2023-10-10 DIAGNOSIS — E785 Hyperlipidemia, unspecified: Secondary | ICD-10-CM

## 2023-10-10 DIAGNOSIS — J3081 Allergic rhinitis due to animal (cat) (dog) hair and dander: Secondary | ICD-10-CM

## 2023-10-10 DIAGNOSIS — R0600 Dyspnea, unspecified: Secondary | ICD-10-CM

## 2023-10-10 DIAGNOSIS — K219 Gastro-esophageal reflux disease without esophagitis: Secondary | ICD-10-CM

## 2023-10-10 DIAGNOSIS — J4540 Moderate persistent asthma, uncomplicated: Secondary | ICD-10-CM | POA: Diagnosis not present

## 2023-10-10 LAB — PULMONARY FUNCTION TEST
DL/VA % pred: 118 %
DL/VA: 5.05 ml/min/mmHg/L
DLCO unc % pred: 93 %
DLCO unc: 29.24 ml/min/mmHg
FEF 25-75 Post: 5.49 L/s
FEF 25-75 Pre: 3.71 L/s
FEF2575-%Change-Post: 47 %
FEF2575-%Pred-Post: 156 %
FEF2575-%Pred-Pre: 105 %
FEV1-%Change-Post: 16 %
FEV1-%Pred-Post: 94 %
FEV1-%Pred-Pre: 81 %
FEV1-Post: 3.93 L
FEV1-Pre: 3.38 L
FEV1FVC-%Change-Post: 1 %
FEV1FVC-%Pred-Pre: 106 %
FEV6-%Change-Post: 14 %
FEV6-%Pred-Post: 90 %
FEV6-%Pred-Pre: 79 %
FEV6-Post: 4.73 L
FEV6-Pre: 4.13 L
FEV6FVC-%Change-Post: 0 %
FEV6FVC-%Pred-Post: 103 %
FEV6FVC-%Pred-Pre: 103 %
FVC-%Change-Post: 14 %
FVC-%Pred-Post: 87 %
FVC-%Pred-Pre: 76 %
FVC-Post: 4.74 L
FVC-Pre: 4.15 L
Post FEV1/FVC ratio: 83 %
Post FEV6/FVC ratio: 100 %
Pre FEV1/FVC ratio: 81 %
Pre FEV6/FVC Ratio: 100 %
RV % pred: 98 %
RV: 2.26 L
TLC % pred: 86 %
TLC: 6.57 L

## 2023-10-10 LAB — POCT EXHALED NITRIC OXIDE: FeNO level (ppb): 26

## 2023-10-10 MED ORDER — FLUTICASONE FUROATE-VILANTEROL 100-25 MCG/ACT IN AEPB: 1.0000 | INHALATION_SPRAY | Freq: Every day | RESPIRATORY_TRACT | 3 refills | Status: DC

## 2023-10-10 MED ORDER — ALBUTEROL SULFATE HFA 108 (90 BASE) MCG/ACT IN AERS: 2.0000 | INHALATION_SPRAY | RESPIRATORY_TRACT | 3 refills | Status: AC | PRN

## 2023-10-10 NOTE — Progress Notes (Signed)
 Full pft performed today.

## 2023-10-10 NOTE — Progress Notes (Signed)
 @Patient  ID: Randall Cortez, male    DOB: Oct 30, 1965, 58 y.o.   MRN: [id number]  Chief Complaint  Patient presents with   Follow-up    PFT F/U    Referring provider: No ref. provider found  HPI:    07/19/2023 -       Chief Complaint  Patient presents with   Pulmonary Consult      Referred by [facility]. Pt c/o cough since August 2024- tx with abx and then got better but worsened again Nov 2024. His cough is non prod. He has occ DOE.       Randall Cortez 58 y.o. 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  Here for new consult evaluation.  He has been on Protonix for at least 20 years for acid reflux.  He has been on fish oil  for the last few years taking it 4 times a week.  He used to exercise regularly on a treadmill walking 3 to 4 miles 4 days a week at work in an effort to lose weight.  He was doing until [place].  Then independent of this around August/September 2024 started developing a nagging cough.  He said over Christmas it got really bad he had to use a lot of albuterol  which seemed to help a little bit.  No antecedent illness.  Then also around Christmas significant wheezing particularly at night.  Expiratory of wheezing definitely.  He says for the last 1 month at a stop and for the last few weeks the cough is also better.  Things have transition into nonspecific dyspnea where he feels he is not able to have a good lung capacity.  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  Asthma is a consideration.  Has been referred here for further evaluation.  Not tried inhaled steroids.   Chronic cough associated history - On fish oil  - Has acid reflux on PPI - Denies any seasonal allergies but is taking [facility] over-the-counter antihistamine for the last 12-18 months. -Not ACE inhibitors - No chronic sinus drainage. -Last imaging available to us  in 2021 was clear.     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   10/10/2023 Discussed the use of AI scribe software for clinical note transcription with the patient, who gave verbal consent to proceed.  History of Present Illness   Randall Cortez is a 58 year old male with asthma who presents for a follow-up regarding his respiratory symptoms.  He has a history of asthma and underwent a breathing test called FENO in February, which showed elevated levels of fractionated nitric oxide . He has experienced a persistent cough lasting several months, which was not fully relieved by inhalers. However, he reports significant improvement with Breo, which he has been taking daily, except for one day,  and describes it as a 'lifesaver'.  He has been living in [place] since 2003 and has a dog at home. Allergy testing revealed a low-level dog dander allergy, classified as class one. His eosinophil count was elevated at 600. Lung function tests showed normal results with mild restriction, and a 16-point improvement in lung function post-bronchodilator.  He is currently on a higher dose of Breo. He also takes Protonix once a day and fish oil , which was suggested by his cardiologist. He has a history of high cholesterol, which is managed with atorvastatin . He has not seen his cardiologist since around the COVID pandemic.      Imaging: 07/19/23 CXR >> Clear lungs, no active cardiopulmonary disease   Pulmonary tresting: 10/10/2023 FENO while on Breo - 26  (114)   10/10/2023 PFTs >> FEV1 3.93 (94%), ratio 94, DLCOunc 29.24 (93%)    No Known Allergies   There is no immunization history on file for this patient.  Past Medical History:  Diagnosis Date   Acute ischemic multifocal posterior circulation stroke involving left-sided vessel (HCC)    Bipolar disorder current episode depressed (HCC)    Cryptogenic stroke (HCC)    GAD (generalized anxiety disorder)    GERD (gastroesophageal reflux disease)    History of adenomatous polyp of colon    HTN (hypertension)    MDD (major depressive disorder)    Peptic ulcer    Prediabetes     Tobacco History: Social History   Tobacco Use  Smoking Status Never   Passive exposure: Past  Smokeless Tobacco Never   Counseling given: Not Answered   Outpatient Medications Prior to Visit  Medication Sig Dispense Refill   albuterol  (VENTOLIN  HFA) 108 (90 Base) MCG/ACT inhaler Inhale 2 puffs into the lungs every 4 (four) hours as needed.     aspirin  EC 81 MG tablet Take 1 tablet (81 mg total) by mouth daily. 90 tablet 3   atorvastatin  (LIPITOR) 20 MG tablet TAKE 1 TABLET BY MOUTH EVERY DAY 30 tablet 8   fluticasone  furoate-vilanterol (BREO ELLIPTA ) 200-25 MCG/ACT AEPB Inhale 1 puff into the lungs daily. 60 each 5   Multiple Vitamin (MULTIVITAMIN) capsule Take 1 capsule by mouth daily.     Omega-3 Fatty Acids (FISH OIL ) 1000 MG CAPS Take 1 capsule (1,000 mg total) by mouth 2 (two) times daily. 180 capsule 1   pantoprazole (PROTONIX) 20 MG tablet Take 20 mg by mouth daily.     No facility-administered medications prior to visit.      Review of Systems  Review of Systems   Physical Exam  BP 122/76 (BP Location: Left Arm, Patient Position: Sitting, Cuff Size: Large)   Pulse 67   Temp 98.3 F (36.8 C) (Oral)   Ht 6\' 1"  (1.854 m)   Wt 264 lb (119.7 kg)   SpO2 95%   BMI 34.83 kg/m  Physical Exam   Lab Results:  CBC    Component Value Date/Time   WBC 10.8 (H) 07/19/2023 0955   RBC  5.48 07/19/2023 0955   HGB 15.9 07/19/2023 0955   HCT 47.6 07/19/2023 0955   PLT 370.0 07/19/2023 0955   MCV 86.9 07/19/2023 0955   MCHC 33.4 07/19/2023 0955   RDW 13.7 07/19/2023 0955   LYMPHSABS 3.3 07/19/2023 0955   MONOABS 0.9 07/19/2023 0955   EOSABS 0.6 07/19/2023 0955   BASOSABS 0.1 07/19/2023 0955    BMET    Component Value Date/Time   NA 142 07/05/2019 1154   K 4.5  07/05/2019 1154   CL 104 07/05/2019 1154   CO2 25 07/05/2019 1154   GLUCOSE 82 07/05/2019 1154   BUN 13 07/05/2019 1154   CREATININE 1.33 (H) 07/05/2019 1154   CALCIUM  9.6 07/05/2019 1154   GFRNONAA 61 07/05/2019 1154   GFRAA 71 07/05/2019 1154    BNP No results found for: "BNP"  ProBNP No results found for: "PROBNP"  Imaging: No results found.   Assessment & Plan:   1. Moderate persistent asthma without complication (Primary) - POCT EXHALED NITRIC OXIDE   Assessment and Plan    Asthma Asthma with an allergic component, evidenced by elevated fractional exhaled nitric oxide  and eosinophils. Symptoms include a persistent cough, which has improved significantly with Breo. Lung function tests show mild restriction but are consistent with asthma, with a 16-point improvement in FEV1 post-bronchodilator, indicating reversibility. He has been well-controlled on the higher dose of Breo for three months. - Continue Breo, consider deescalating to lower dose if symptoms remain controlled. - Ensure availability of albuterol  and refill as needed. - Educate on the importance of having albuterol  for emergencies. - Discuss potential to increase Breo dose during respiratory infections or symptom flare-ups. - Consider exercise to improve lung function.  Gastroesophageal reflux disease (GERD) GERD potentially exacerbated by fish oil  intake, which can cause reflux and contribute to cough. He is on Protonix once a day. - Consider trialing off fish oil  for a few weeks to assess impact on cough. - Discuss with  cardiologist or primary care provider regarding discontinuation of fish oil .  Dog dander allergy Dog dander allergy present but at a low level (class 0-1), not requiring removal of the dog from the home.  Hyperlipidemia Hyperlipidemia managed with atorvastatin , which has effectively controlled cholesterol levels. - Continue atorvastatin  as prescribed.  Antonio Baumgarten, NP 10/10/2023

## 2023-10-10 NOTE — Patient Instructions (Addendum)
-  ASTHMA: Asthma is a condition where your airways narrow and swell, producing extra mucus. Your asthma has an allergic component, and your symptoms, including a persistent cough, have improved significantly with Breo. Continue taking Breo, and we may consider lowering the dose if your symptoms remain controlled. Ensure you have albuterol  available for emergencies, and we may increase the Breo dose during respiratory infections or flare-ups. Regular exercise can also help improve lung function.  -GASTROESOPHAGEAL REFLUX DISEASE (GERD): GERD is a condition where stomach acid frequently flows back into the tube connecting your mouth and stomach. Your GERD may be exacerbated by fish oil  intake, which can cause reflux and contribute to your cough. Consider stopping fish oil  for a few weeks to see if your cough improves, and discuss this with your cardiologist or primary care provider.  -DOG DANDER ALLERGY: You have a low-level allergy to dog dander, which does not require you to remove your dog from your home.  -HYPERLIPIDEMIA: Hyperlipidemia is having high levels of fats in your blood. Your cholesterol levels are well-controlled with atorvastatin , so continue taking it as prescribed.  INSTRUCTIONS: Please continue taking your medications as discussed. Consider stopping fish oil  for a few weeks and discuss this with your cardiologist or primary care provider. Ensure you have albuterol  available for emergencies. Follow up with your cardiologist, especially since you have not seen them since the COVID pandemic.  Follow-up 4-6 months with Jerlene Moody NP

## 2023-10-10 NOTE — Patient Instructions (Signed)
 Full pft performed today.

## 2023-12-30 ENCOUNTER — Other Ambulatory Visit (HOSPITAL_BASED_OUTPATIENT_CLINIC_OR_DEPARTMENT_OTHER): Payer: Self-pay | Admitting: Physician Assistant

## 2023-12-30 DIAGNOSIS — R202 Paresthesia of skin: Secondary | ICD-10-CM

## 2024-02-03 ENCOUNTER — Ambulatory Visit (HOSPITAL_BASED_OUTPATIENT_CLINIC_OR_DEPARTMENT_OTHER)
Admission: RE | Admit: 2024-02-03 | Discharge: 2024-02-03 | Disposition: A | Discharge status: Home / Self Care | Source: Ambulatory Visit | Attending: Physician Assistant | Admitting: Physician Assistant

## 2024-02-03 DIAGNOSIS — R202 Paresthesia of skin: Secondary | ICD-10-CM | POA: Diagnosis present

## 2024-05-04 ENCOUNTER — Ambulatory Visit: Admitting: Diagnostic Neuroimaging

## 2024-05-04 ENCOUNTER — Encounter: Payer: Self-pay | Admitting: Diagnostic Neuroimaging

## 2024-05-04 VITALS — BP 119/69 | HR 89 | Ht 73.0 in | Wt 265.0 lb

## 2024-05-04 DIAGNOSIS — H532 Diplopia: Secondary | ICD-10-CM

## 2024-05-04 DIAGNOSIS — R200 Anesthesia of skin: Secondary | ICD-10-CM | POA: Diagnosis not present

## 2024-05-04 DIAGNOSIS — R531 Weakness: Secondary | ICD-10-CM | POA: Diagnosis not present

## 2024-05-04 DIAGNOSIS — G4710 Hypersomnia, unspecified: Secondary | ICD-10-CM

## 2024-05-04 DIAGNOSIS — R0683 Snoring: Secondary | ICD-10-CM

## 2024-05-04 DIAGNOSIS — R202 Paresthesia of skin: Secondary | ICD-10-CM | POA: Diagnosis not present

## 2024-05-04 NOTE — Progress Notes (Signed)
 [facility]  PATIENT: Randall Cortez DOB: 1965/08/30  REFERRING CLINICIAN: Lennice Mliss NOVAK, PA HISTORY FROM: patient  REASON FOR VISIT: new consult   HISTORICAL  CHIEF COMPLAINT:  Chief Complaint  Patient presents with   Tremors    Rm 6 alone Pt is well, reports intermittent L hand tremor, spasms, weakness and overall fatigue. Symptoms have been persistent for about a yr and half. He also mention numbness      HISTORY OF PRESENT ILLNESS:   58 year old male here for evaluation of numbness, weakness, spasm, double vision.  Patient reports history of intermittent right arm and left leg numbness since 2019.  Had EMG nerve conduction study in 2019 which was unremarkable.  Since that time has some intermittent episodes of double vision lasting 10 to 15 seconds.  Also has some intermittent spasms of the left hand.  Has noted some generalized weakness spells as well.  He describes crawling needlelike sensations in his face, chest and arms.  Has some interrupted sleep at night.  Drinks about 6 to 8 cans of soda per day.  Has had some snoring issues.   REVIEW OF SYSTEMS: Full 14 system review of systems performed and negative with exception of: as per HPI.  ALLERGIES: Allergies[1]  HOME MEDICATIONS: Outpatient Medications Prior to Visit  Medication Sig Dispense Refill   albuterol  (VENTOLIN  HFA) 108 (90 Base) MCG/ACT inhaler Inhale 2 puffs into the lungs every 4 (four) hours as needed. 18 g 3   fluticasone  furoate-vilanterol (BREO ELLIPTA ) 100-25 MCG/ACT AEPB Inhale 1 puff into the lungs daily. 60 each 3   pantoprazole (PROTONIX) 20 MG tablet Take 20 mg by mouth daily.     tamsulosin (FLOMAX) 0.4 MG CAPS capsule Take 0.4 mg by mouth daily.     aspirin  EC 81 MG tablet Take 1 tablet (81 mg total) by mouth daily. (Patient not taking: Reported on 05/04/2024) 90 tablet 3   atorvastatin  (LIPITOR) 20 MG tablet TAKE 1 TABLET BY MOUTH EVERY DAY (Patient not taking: Reported  on 05/04/2024) 30 tablet 8   Multiple Vitamin (MULTIVITAMIN) capsule Take 1 capsule by mouth daily. (Patient not taking: Reported on 05/04/2024)     Omega-3 Fatty Acids (FISH OIL ) 1000 MG CAPS Take 1 capsule (1,000 mg total) by mouth 2 (two) times daily. (Patient not taking: Reported on 05/04/2024) 180 capsule 1   No facility-administered medications prior to visit.    PAST MEDICAL HISTORY: Past Medical History:  Diagnosis Date   Acute ischemic multifocal posterior circulation stroke involving left-sided vessel (HCC)    Bipolar disorder current episode depressed (HCC)    Cryptogenic stroke (HCC)    GAD (generalized anxiety disorder)    GERD (gastroesophageal reflux disease)    History of adenomatous polyp of colon    HTN (hypertension)    MDD (major depressive disorder)    Peptic ulcer    Prediabetes     PAST SURGICAL HISTORY: Past Surgical History:  Procedure Laterality Date   APPENDECTOMY  1984   ESOPHAGOGASTRODUODENOSCOPY  2012   Balloon   FLEXIBLE SIGMOIDOSCOPY  2000   EDG in [place]    sweat gland  1985   I & D on right axillae    VASECTOMY  2012    FAMILY HISTORY: Family History  Problem Relation Age of Onset   Glaucoma Mother    Heart attack Mother 5       AND 28   Lung disease Father        welder  CVA Maternal Grandmother    Diabetes Mellitus I Paternal Grandmother     SOCIAL HISTORY: Social History   Socioeconomic History   Marital status: Married    Spouse name: Not on file   Number of children: Not on file   Years of education: Not on file   Highest education level: Not on file  Occupational History   Occupation: [employer]  Tobacco Use   Smoking status: Never    Passive exposure: Past   Smokeless tobacco: Never  Vaping Use   Vaping status: Never Used  Substance and Sexual Activity   Alcohol use: Yes    Comment: 4 glasses of wine per month   Drug use: No   Sexual activity: Not on file  Other Topics Concern   Not on file   Social History Narrative   R handed, Tobacco Use Cigarettes: Never SmokedAlcohol: Yes, 4 glasses of wine per monthNo recreational drug UseDiet: Eating better now, not as much fast foodExercise: NoneOccupation: Employed, safety officer/geologistMarital Status: MarriedChildren: 3Firearms: NoSeat Belt Use: YesHome Electrical engineer use: Yes   Social Drivers of Health   Tobacco Use: Low Risk (05/04/2024)   Patient History    Smoking Tobacco Use: Never    Smokeless Tobacco Use: Never    Passive Exposure: Past  Financial Resource Strain: Not on file  Food Insecurity: Not on file  Transportation Needs: Not on file  Physical Activity: Not on file  Stress: Not on file  Social Connections: Not on file  Intimate Partner Violence: Not on file  Depression (EYV7-0): Not on file  Alcohol Screen: Not on file  Housing: Not on file  Utilities: Not on file  Health Literacy: Not on file     PHYSICAL EXAM  GENERAL EXAM/CONSTITUTIONAL: Vitals:  Vitals:   05/04/24 0922  BP: 119/69  Pulse: 89  Weight: 265 lb (120.2 kg)  Height: 6' 1 (1.854 m)   Body mass index is 34.96 kg/m. Wt Readings from Last 3 Encounters:  05/04/24 265 lb (120.2 kg)  10/10/23 264 lb (119.7 kg)  07/19/23 263 lb (119.3 kg)   Patient is in no distress; well developed, nourished and groomed; neck is supple  CARDIOVASCULAR: Examination of carotid arteries is normal; no carotid bruits Regular rate and rhythm, no murmurs Examination of peripheral vascular system by observation and palpation is normal  EYES: Ophthalmoscopic exam of optic discs and posterior segments is normal; no papilledema or hemorrhages No results found.  MUSCULOSKELETAL: Gait, strength, tone, movements noted in Neurologic exam below  NEUROLOGIC: MENTAL STATUS:      No data to display         awake, alert, oriented to person, place and time recent and remote memory intact normal attention and concentration language fluent, comprehension  intact, naming intact fund of knowledge appropriate  CRANIAL NERVE:  2nd - no papilledema on fundoscopic exam 2nd, 3rd, 4th, 6th - pupils equal and reactive to light, visual fields full to confrontation, extraocular muscles intact, no nystagmus 5th - facial sensation symmetric 7th - facial strength symmetric 8th - hearing intact 9th - palate elevates symmetrically, uvula midline 11th - shoulder shrug symmetric 12th - tongue protrusion midline  MOTOR:  normal bulk and tone, full strength in the BUE, BLE  SENSORY:  normal and symmetric to light touch, temperature, vibration  COORDINATION:  finger-nose-finger, fine finger movements normal  REFLEXES:  deep tendon reflexes 1+ and symmetric  GAIT/STATION:  narrow based gait     DIAGNOSTIC DATA (LABS, IMAGING, TESTING) -  I reviewed patient records, labs, notes, testing and imaging myself where available.  Lab Results  Component Value Date   WBC 10.8 (H) 07/19/2023   HGB 15.9 07/19/2023   HCT 47.6 07/19/2023   MCV 86.9 07/19/2023   PLT 370.0 07/19/2023      Component Value Date/Time   NA 142 07/05/2019 1154   K 4.5 07/05/2019 1154   CL 104 07/05/2019 1154   CO2 25 07/05/2019 1154   GLUCOSE 82 07/05/2019 1154   BUN 13 07/05/2019 1154   CREATININE 1.33 (H) 07/05/2019 1154   CALCIUM  9.6 07/05/2019 1154   PROT 7.4 07/05/2019 1154   ALBUMIN 4.4 07/05/2019 1154   AST 17 07/05/2019 1154   ALT 22 07/05/2019 1154   ALKPHOS 90 07/05/2019 1154   BILITOT 0.5 07/05/2019 1154   GFRNONAA 61 07/05/2019 1154   GFRAA 71 07/05/2019 1154   Lab Results  Component Value Date   CHOL 153 07/05/2019   HDL 37 (L) 07/05/2019   LDLCALC 81 07/05/2019   TRIG 211 (H) 07/05/2019   CHOLHDL 4.1 07/05/2019   No results found for: HGBA1C No results found for: VITAMINB12 No results found for: TSH  02/15/18 EMG/NCS (Dr. Jenel) - Nerve conduction studies involving the right upper extremity and left lower extremity were within normal  limits. No evidence of a neuropathy is seen. EMG evaluation of the left lower extremity is unremarkable, no evidence of a lumbosacral radiculopathy is seen.    ASSESSMENT AND PLAN  58 y.o. year old male here with:  Dx:  1. Numbness and tingling   2. Double vision   3. Numbness of left anterior thigh   4. Snoring   5. Excessive sleepiness   6. Weakness     PLAN:  ARM / HAND NUMBNESS, WEAKNESS, SPASMS  - check MRI cervical spine (rule out myelopathy)  TRANSIENT DOUBLE VISION / WEAKNESS - check AchR ab (eval for myasthenia gravis)  SNORING / INSOMNIA / DAYTIME FATIGUE - advised to gradually reduce caffeine intake - check sleep study  Orders Placed This Encounter  Procedures   MR CERVICAL SPINE W WO CONTRAST   AChR Abs with Reflex to MuSK   Ambulatory referral to Sleep Studies   Return for pending test results, pending if symptoms worsen or fail to improve.  I reviewed labs, notes, records myself. I summarized findings and reviewed with patient, for this high risk condition (myelopathy, neuromuscular junction dz evaluation) requiring high complexity decision making.    EDUARD FABIENE HANLON, MD 05/04/2024, 10:31 AM Certified in Neurology, Neurophysiology and Neuroimaging  [facility] [address], [address] [place], [place] [address] [phone]     [1] No Known Allergies

## 2024-05-04 NOTE — Patient Instructions (Addendum)
°  ARM / HAND NUMBNESS, WEAKNESS, SPASMS  - check MRI cervical spine (rule out myelopathy)  TRANSIENT DOUBLE VISION / WEAKNESS - check AchR ab  SNORING / INSOMNIA / DAYTIME FATIGUE - check sleep study

## 2024-05-22 LAB — MUSK ANTIBODIES: MuSK Antibodies: <1 U/mL

## 2024-05-22 LAB — ACHR ABS WITH REFLEX TO MUSK: AChR Binding Ab, Serum: <0.07 nmol/L (ref 0.00–0.24)

## 2024-05-25 ENCOUNTER — Other Ambulatory Visit: Payer: Self-pay | Admitting: Primary Care

## 2024-05-27 ENCOUNTER — Ambulatory Visit: Payer: Self-pay | Admitting: Diagnostic Neuroimaging

## 2024-05-29 ENCOUNTER — Ambulatory Visit

## 2024-05-29 DIAGNOSIS — R531 Weakness: Secondary | ICD-10-CM | POA: Diagnosis not present

## 2024-05-29 DIAGNOSIS — R202 Paresthesia of skin: Secondary | ICD-10-CM | POA: Diagnosis not present

## 2024-05-29 DIAGNOSIS — R200 Anesthesia of skin: Secondary | ICD-10-CM

## 2024-05-29 MED ORDER — GADOBENATE DIMEGLUMINE 529 MG/ML IV SOLN
20.0000 mL | Freq: Once | INTRAVENOUS | Status: AC | PRN
  Administered 2024-05-29: 20 mL via INTRAVENOUS

## 2024-06-01 ENCOUNTER — Encounter: Payer: Self-pay | Admitting: Internal Medicine

## 2024-06-01 ENCOUNTER — Ambulatory Visit: Admitting: Internal Medicine

## 2024-06-01 VITALS — BP 112/68 | HR 63 | Temp 97.6°F | Ht 73.0 in | Wt 257.0 lb

## 2024-06-01 DIAGNOSIS — R053 Chronic cough: Secondary | ICD-10-CM | POA: Diagnosis not present

## 2024-06-01 DIAGNOSIS — R0982 Postnasal drip: Secondary | ICD-10-CM | POA: Diagnosis not present

## 2024-06-01 DIAGNOSIS — J4541 Moderate persistent asthma with (acute) exacerbation: Secondary | ICD-10-CM | POA: Diagnosis not present

## 2024-06-01 DIAGNOSIS — B370 Candidal stomatitis: Secondary | ICD-10-CM

## 2024-06-01 DIAGNOSIS — J4540 Moderate persistent asthma, uncomplicated: Secondary | ICD-10-CM

## 2024-06-01 LAB — NITRIC OXIDE: Nitric Oxide: 13

## 2024-06-01 MED ORDER — FLUCONAZOLE 100 MG PO TABS: 100.0000 mg | ORAL_TABLET | Freq: Every day | ORAL | 0 refills | Status: AC

## 2024-06-01 MED ORDER — AZELASTINE HCL 0.1 % NA SOLN: 2.0000 | Freq: Two times a day (BID) | NASAL | 12 refills | Status: AC

## 2024-06-01 MED ORDER — PREDNISONE 10 MG PO TABS: ORAL_TABLET | ORAL | 0 refills | Status: AC

## 2024-06-01 NOTE — Patient Instructions (Addendum)
"    ICD-10-CM   1. Moderate persistent asthma with acute exacerbation  J45.41     2. Chronic cough  R05.3 Nitric oxide     3. Oral thrush  B37.0     4. Post-nasal drip  R09.82       Moderate persistent asthma with acute exacerbation Chronic cough   Plan  - Please take prednisone  40 mg x1 day, then 30 mg x1 day, then 20 mg x1 day, then 10 mg x1 day, and then 5 mg x1 day and stop - Use sugarless lozenges as needed - Drink a lot of water - Take the weekend to do complete voice rest and for the next few to several weeks talk much less - Continue Breo schedule - Okay to restart Protonix - Do albuterol  as needed - Do Astelin  nasal spray 2 squirts into each nostril daily - Be patient for up to 8 weeks but certainly if the cough is getting worse or any new problems develop call or sooner  #Postnasal drip    Plan - Do Astelin  for 30 days nasal spray  Oral thrush  -You do have oral thrush on exam  Plan  - Diflucan  100 mg daily x 5 days [given the fact you are taking oral steroids I am thinking I will just give oral course of Diflucan  in case there is thrush on the vocal cords right now which can be making the cough worse   Follow-up - Return to see nurse practitioner or Dr. Geronimo in 6 months or sooner if needed "

## 2024-06-01 NOTE — Progress Notes (Signed)
 "     OV 07/19/2023  Subjective:  Patient ID: Randall Cortez, male , DOB: Apr 10, 1966 , age 59 y.o. , MRN: [id number] , ADDRESS: [address] [place] [address] PCP Patient, No Pcp Per Patient Care Team: Patient, No Pcp Per as PCP - General (General Practice) Brien Josette HERO, PA-C (Inactive) as Physician Assistant (Physician Assistant)  This Provider for this visit: Treatment Team:  Attending Provider: Geronimo Amel, MD    07/19/2023 -   Chief Complaint  Patient presents with   Pulmonary Consult    Referred by [facility]. Pt c/o cough since August 2024- tx with abx and then got better but worsened again Nov 2024. His cough is non prod. He has occ DOE.      HPI Randall Cortez 59 y.o. 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  Here for new consult evaluation.  He has been on Protonix for at least 20 years for acid reflux.  He has been on fish oil  for the last few years taking it 4 times a week.  He used to exercise regularly on a treadmill walking 3 to 4 miles 4 days a week at work in an effort to lose weight.  He was doing until August/September.  Then independent of this around August/September 2024 started developing a nagging cough.  He said over Christmas it got really bad he had to use a lot of albuterol  which seemed to help a little bit.  No antecedent illness.  Then also around Christmas significant wheezing particularly at night.  Expiratory of wheezing definitely.  He says for the last 1 month at a stop and for the last few weeks the cough is also better.  Things have transition into nonspecific dyspnea where he feels he is not able to have a good lung capacity.  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  Asthma is a consideration.  Has been referred here for further evaluation.  Not tried inhaled steroids.  Chronic cough associated history - On fish oil  - Has acid reflux on PPI - Denies any seasonal allergies but is taking [facility] over-the-counter  antihistamine for the last 12-18 months. -Not ACE inhibitors - No chronic sinus drainage. -Last imaging available to us  in 2021 was clear.   - FENO 113 ppb and PSITIVE  CT Chest data from date:   - personally visualized and independently interpreted : no  OVER-READ INTERPRETATION  CT CHEST   The following report is an over-read performed by radiologist Dr. Toribio Aye of [facility] on 09/24/2019. This over-read does not include interpretation of cardiac or coronary anatomy or pathology. The coronary calcium  score/coronary CTA interpretation by the cardiologist is attached.   COMPARISON:  None.   FINDINGS: Within the visualized portions of the thorax there are no suspicious appearing pulmonary nodules or masses, there is no acute consolidative airspace disease, no pleural effusions, no pneumothorax and no lymphadenopathy. Visualized portions of the upper abdomen are unremarkable. There are no aggressive appearing lytic or blastic lesions noted in the visualized portions of the skeleton.   IMPRESSION: No significant incidental noncardiac findings are noted.   Electronically Signed: By: Toribio Aye M.D. On: 09/24/2019 08:39    PFT   10/10/2023 Discussed the use of AI scribe software for clinical note transcription with the patient, who gave verbal consent to proceed.  History of Present Illness   Randall Cortez is a 59 year old male with asthma who presents for a follow-up regarding his respiratory symptoms.  He has a history of asthma and underwent a breathing test called FENO in February, which showed elevated levels of fractionated nitric oxide . He has experienced a persistent cough lasting several months, which was not fully relieved by inhalers. However, he reports significant improvement with Breo, which he has been taking daily, except for one day, and describes it as a 'lifesaver'.  He has been living in [place] since 2003 and has a dog at  home. Allergy testing revealed a low-level dog dander allergy, classified as class one. His eosinophil count was elevated at 600. Lung function tests showed normal results with mild restriction, and a 16-point improvement in lung function post-bronchodilator.  He is currently on a higher dose of Breo. He also takes Protonix once a day and fish oil , which was suggested by his cardiologist. He has a history of high cholesterol, which is managed with atorvastatin . He has not seen his cardiologist since around the COVID pandemic.       OV 06/01/2024  Subjective:  Patient ID: Randall Cortez, male , DOB: 01-25-1966 , age 31 y.o. , MRN: [id number] , ADDRESS: [address] [place] [address] PCP Lennice Mliss NOVAK, PA Patient Care Team: Lennice Mliss NOVAK, [place] as PCP - General (Physician Assistant) Brien Josette HERO, PA-C (Inactive) as Physician Assistant (Physician Assistant)  This Provider for this visit: Treatment Team:  Attending Provider: Geronimo Amel, MD    06/01/2024 -   Chief Complaint  Patient presents with   Acute Visit    Increased cough since mid Dec 2025- had Zpack, and then is currently on levaquin. Just finished medrol dosepack. He is not producing any mucus.      HPI Randall Cortez 59 y.o. -Randall Cortez is a 59 year old male with Type 2 asthma on BREO who presents with a persistent cough.  He developed a cough approximately three to four weeks ago. Family was sick 3 weeks priort.  Initially, the cough was unproductive but deep, described as a 'room clearing cough.' He sought medical attention and was prescribed a Z-Pak, which initially reduced the cough. However, during a cruise from December 20th to 28th, the cough returned and worsened. Upon returning home, he visited urgent care on January 2nd and was prescribed a stronger antibiotic, which significantly reduced the cough. He also completed a course of steroids, ending on January 8th,. Despite these treatments, he  experienced episodes of feeling like there was liquid in his lungs, which would temporarily resolve with coughing, particularly last night over a period of three to four hours.  His family was sick with a cough and runny nose about three weeks before his symptoms began. He has not been taking his Protonix for eight days due to advice from a pharmacist regarding interactions with his current medication.  He has a runny nose and occasional nasal drainage, which he believes may contribute to his cough. He is concerned about the potential for his symptoms to rebound, as he experienced a similar severe cough about a year ago.  EXMA HAS THRUSH  Feno normal 06/01/2024   Lab Results  Component Value Date   NITRICOXIDE 13 06/01/2024     PFT     Latest Ref Rng & Units 10/10/2023    8:09 AM  PFT Results  FVC-Pre L 4.15   FVC-Predicted Pre % 76   FVC-Post L 4.74   FVC-Predicted Post % 87   Pre FEV1/FVC % % 81   Post FEV1/FCV % % 83   FEV1-Pre  L 3.38   FEV1-Predicted Pre % 81   FEV1-Post L 3.93   DLCO uncorrected ml/min/mmHg 29.24   DLCO UNC% % 93   DLVA Predicted % 118   TLC L 6.57   TLC % Predicted % 86   RV % Predicted % 98        LAB RESULTS last 96 hours No results found.       has a past medical history of Acute ischemic multifocal posterior circulation stroke involving left-sided vessel (HCC), Bipolar disorder current episode depressed (HCC), Cryptogenic stroke (HCC), GAD (generalized anxiety disorder), GERD (gastroesophageal reflux disease), History of adenomatous polyp of colon, HTN (hypertension), MDD (major depressive disorder), Peptic ulcer, and Prediabetes.   reports that he has never smoked. He has been exposed to tobacco smoke. He has never used smokeless tobacco.  Past Surgical History:  Procedure Laterality Date   APPENDECTOMY  1984   ESOPHAGOGASTRODUODENOSCOPY  2012   Balloon   FLEXIBLE SIGMOIDOSCOPY  2000   EDG in [place]    sweat gland  1985   I & D  on right axillae    VASECTOMY  2012    Allergies[1]   There is no immunization history on file for this patient.  Family History  Problem Relation Age of Onset   Glaucoma Mother    Heart attack Mother 80       AND 16   Lung disease Father        welder   CVA Maternal Grandmother    Diabetes Mellitus I Paternal Grandmother     Current Medications[2]      Objective:   Vitals:   06/01/24 0840  BP: 112/68  Pulse: 63  Temp: 97.6 F (36.4 C)  TempSrc: Oral  SpO2: 96%  Weight: 257 lb (116.6 kg)  Height: 6' 1 (1.854 m)    Estimated body mass index is 33.91 kg/m as calculated from the following:   Height as of this encounter: 6' 1 (1.854 m).   Weight as of this encounter: 257 lb (116.6 kg).  @WEIGHTCHANGE @  [employer]   06/01/24 0840  Weight: 257 lb (116.6 kg)     Physical Exam   General: No distress. Looks well O2 at rest: no Cane present: no Sitting in wheel chair: no Frail: no Obese: bmi 33 Neuro: Alert and Oriented x 3. GCS 15. Speech normal Psych: Pleasant Resp:  Barrel Chest - no.  Wheeze - no, Crackles - no, No overt respiratory distress CVS: Normal heart sounds. Murmurs - no Ext: Stigmata of Connective Tissue Disease - no HEENT: Normal upper airway. PEERL +. No post nasal drip. HOARSE COUGH. THRUSH +        Assessment/     Assessment & Plan Chronic cough  Moderate persistent asthma with acute exacerbation  Oral thrush  Post-nasal drip    PLAN Patient Instructions     ICD-10-CM   1. Moderate persistent asthma with acute exacerbation  J45.41     2. Chronic cough  R05.3 Nitric oxide     3. Oral thrush  B37.0     4. Post-nasal drip  R09.82       Moderate persistent asthma with acute exacerbation Chronic cough   Plan  - Please take prednisone  40 mg x1 day, then 30 mg x1 day, then 20 mg x1 day, then 10 mg x1 day, and then 5 mg x1 day and stop - Use sugarless lozenges as needed - Drink a lot of water - Take the  weekend  to do complete voice rest and for the next few to several weeks talk much less - Continue Breo schedule - Okay to restart Protonix - Do albuterol  as needed - Do Astelin  nasal spray 2 squirts into each nostril daily - Be patient for up to 8 weeks but certainly if the cough is getting worse or any new problems develop call or sooner  #Postnasal drip    Plan - Do Astelin  for 30 days nasal spray  Oral thrush  -You do have oral thrush on exam  Plan  - Diflucan  100 mg daily x 5 days [given the fact you are taking oral steroids I am thinking I will just give oral course of Diflucan  in case there is thrush on the vocal cords right now which can be making the cough worse   Follow-up - Return to see nurse practitioner or Dr. Geronimo in 6 months or sooner if needed    FOLLOWUP    Return for - Return to see nurse practitioner or Dr. Geronimo in 6 months or sooner if needed.    SIGNATURE    Dr. Dorethia Geronimo, M.D., F.C.C.P,  Pulmonary and Critical Care Medicine Staff Physician, [facility] Director - Interstitial Lung Disease  Program  Pulmonary Fibrosis [facility] at [facility] [place], [place], [address]  Pager: [phone], If no answer or between  15:00h - 7:00h: call [phone] Telephone: [phone]  9:11 AM 06/01/2024     [1] No Known Allergies [2]  Current Outpatient Medications:    albuterol  (VENTOLIN  HFA) 108 (90 Base) MCG/ACT inhaler, Inhale 2 puffs into the lungs every 4 (four) hours as needed., Disp: 18 g, Rfl: 3   azelastine  (ASTELIN ) 0.1 % nasal spray, Place 2 sprays into both nostrils 2 (two) times daily. Use in each nostril as directed, Disp: 30 mL, Rfl: 12   BREO ELLIPTA  100-25 MCG/ACT AEPB, TAKE 1 PUFF BY MOUTH EVERY DAY, Disp: 60 each, Rfl: 3   fluconazole  (DIFLUCAN ) 100 MG tablet, Take 1 tablet (100 mg total) by mouth daily for 5 days., Disp: 5 tablet, Rfl: 0   levofloxacin (LEVAQUIN) 500 MG  tablet, Take 500 mg by mouth daily., Disp: , Rfl:    predniSONE  (DELTASONE ) 10 MG tablet, Please take prednisone  40 mg x1 day, then 30 mg x1 day, then 20 mg x1 day, then 10 mg x1 day, and then 5 mg x1 day and stop, Disp: 11 tablet, Rfl: 0   promethazine-dextromethorphan (PROMETHAZINE-DM) 6.25-15 MG/5ML syrup, Take 5 mLs by mouth 4 (four) times daily., Disp: , Rfl:    tamsulosin (FLOMAX) 0.4 MG CAPS capsule, Take 0.4 mg by mouth daily., Disp: , Rfl:    pantoprazole (PROTONIX) 20 MG tablet, Take 20 mg by mouth daily. (Patient not taking: Reported on 06/01/2024), Disp: , Rfl:   "

## 2024-06-20 ENCOUNTER — Ambulatory Visit (INDEPENDENT_AMBULATORY_CARE_PROVIDER_SITE_OTHER): Admitting: Neurology

## 2024-06-20 ENCOUNTER — Encounter: Payer: Self-pay | Admitting: Neurology

## 2024-06-20 VITALS — BP 106/67 | HR 64 | Ht 73.0 in | Wt 263.4 lb

## 2024-06-20 DIAGNOSIS — E66811 Obesity, class 1: Secondary | ICD-10-CM

## 2024-06-20 DIAGNOSIS — Z8673 Personal history of transient ischemic attack (TIA), and cerebral infarction without residual deficits: Secondary | ICD-10-CM

## 2024-06-20 DIAGNOSIS — Z9189 Other specified personal risk factors, not elsewhere classified: Secondary | ICD-10-CM

## 2024-06-20 DIAGNOSIS — R0683 Snoring: Secondary | ICD-10-CM

## 2024-06-20 DIAGNOSIS — G479 Sleep disorder, unspecified: Secondary | ICD-10-CM

## 2024-06-20 DIAGNOSIS — G4719 Other hypersomnia: Secondary | ICD-10-CM | POA: Diagnosis not present

## 2024-06-20 NOTE — Progress Notes (Signed)
 Subjective:    Patient ID: Randall Cortez is a 59 y.o. male.  HPI    True Mar, MD, PhD [facility] [address] [address] [place], [place] [address]  Dear Eduard,  I saw your patient, Randall Cortez, upon your kind request in my sleep clinic today for initial consultation of his sleep disorder, in particular, concern for underlying obstructive sleep apnea.  The patient is unaccompanied today.  As you know, Randall Cortez is a 59 year old male with an underlying medical history of asthma, bipolar disorder, stroke, reflux disease, anxiety, depression, paresthesia, peptic ulcer disease, prediabetes, and obesity, who reports snoring and excessive daytime somnolence.  His Epworth sleepiness score is 10 out of 24, fatigue severity score is 39 out of 63.  I reviewed your office note from 05/04/2024.  He reported sleep disruption.  He was encouraged to reduce his caffeine intake at the time.  He is generally working on caffeine reduction.  He has had weight gain in the realm of 20 pounds in the past 5 to 6 years, mostly during COVID.  He is a non-smoker and drinks alcohol very occasionally, caffeine in the form of soda, 2-3 bottles per day and occasional coffee.  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  Bedtime is generally between 10 and 10:30 PM and rise time between 6:30 AM and 7.  He had a tonsillectomy as a young adult.  He has 3 boys, he has a holiday representative at [employer], he is a printmaker at [employer] and he has a medical laboratory scientific officer in high school who lives at home.  Patient lives with his wife and youngest son.  He is a restless sleeper, typically starts off on his stomach or side but snoring gets significantly worse on his back.  Most often, he has his wife do not sleep in the same room any longer.  She is a psychologist, forensic. He denies nightly nocturia, he denies recurrent nocturnal or morning headaches.  He is not aware of any family  history of sleep apnea.  His Past Medical History Is Significant For: Past Medical History:  Diagnosis Date   Acute ischemic multifocal posterior circulation stroke involving left-sided vessel (HCC)    Bipolar disorder current episode depressed (HCC)    Cryptogenic stroke (HCC)    GAD (generalized anxiety disorder)    GERD (gastroesophageal reflux disease)    History of adenomatous polyp of colon    HTN (hypertension)    MDD (major depressive disorder)    Peptic ulcer    Prediabetes     His Past Surgical History Is Significant For: Past Surgical History:  Procedure Laterality Date   APPENDECTOMY  1984   ESOPHAGOGASTRODUODENOSCOPY  2012   Balloon   FLEXIBLE SIGMOIDOSCOPY  2000   EDG in [place]    sweat gland  1985   I & D on right axillae    VASECTOMY  2012    His Family History Is Significant For: Family History  Problem Relation Age of Onset   Glaucoma Mother    Heart attack Mother 87       AND 60   Lung disease Father        welder   CVA Maternal Grandmother    Diabetes Mellitus I Paternal Grandmother    Sleep apnea Neg Hx     His Social History Is Significant For: Social History   Socioeconomic History   Marital status: Married  Spouse name: Not on file   Number of children: Not on file   Years of education: Not on file   Highest education level: Not on file  Occupational History   Occupation: [employer]  Tobacco Use   Smoking status: Never    Passive exposure: Past   Smokeless tobacco: Never  Vaping Use   Vaping status: Never Used  Substance and Sexual Activity   Alcohol use: Yes    Comment: 4 glasses of wine per month   Drug use: No   Sexual activity: Not on file  Other Topics Concern   Not on file  Social History Narrative   R handed, Tobacco Use Cigarettes: Never SmokedAlcohol: Yes, 4 glasses of wine per monthNo recreational drug UseDiet: Eating better now, not as much fast foodExercise: NoneOccupation: Employed, safety  officer/geologistMarital Status: MarriedChildren: 3Firearms: NoSeat Belt Use: YesHome Electrical engineer use: Yes   Social Drivers of Health   Tobacco Use: Low Risk (06/20/2024)   Patient History    Smoking Tobacco Use: Never    Smokeless Tobacco Use: Never    Passive Exposure: Past  Financial Resource Strain: Not on file  Food Insecurity: Not on file  Transportation Needs: Not on file  Physical Activity: Not on file  Stress: Not on file  Social Connections: Not on file  Depression (EYV7-0): Not on file  Alcohol Screen: Not on file  Housing: Not on file  Utilities: Not on file  Health Literacy: Not on file    His Allergies Are:  Allergies[1]:   His Current Medications Are:  Outpatient Encounter Medications as of 06/20/2024  Medication Sig   albuterol  (VENTOLIN  HFA) 108 (90 Base) MCG/ACT inhaler Inhale 2 puffs into the lungs every 4 (four) hours as needed.   azelastine  (ASTELIN ) 0.1 % nasal spray Place 2 sprays into both nostrils 2 (two) times daily. Use in each nostril as directed   BREO ELLIPTA  100-25 MCG/ACT AEPB TAKE 1 PUFF BY MOUTH EVERY DAY   pantoprazole (PROTONIX) 20 MG tablet Take 20 mg by mouth daily.   tamsulosin (FLOMAX) 0.4 MG CAPS capsule Take 0.4 mg by mouth daily.   levofloxacin (LEVAQUIN) 500 MG tablet Take 500 mg by mouth daily.   predniSONE  (DELTASONE ) 10 MG tablet Please take prednisone  40 mg x1 day, then 30 mg x1 day, then 20 mg x1 day, then 10 mg x1 day, and then 5 mg x1 day and stop   promethazine-dextromethorphan (PROMETHAZINE-DM) 6.25-15 MG/5ML syrup Take 5 mLs by mouth 4 (four) times daily.   No facility-administered encounter medications on file as of 06/20/2024.  :   Review of Systems:  Out of a complete 14 point review of systems, all are reviewed and negative with the exception of these symptoms as listed below:   Review of Systems  Objective:  Neurological Exam  Physical Exam Physical Examination:   Vitals:   06/20/24 0913  BP: 106/67   Pulse: 64    General Examination: The patient is a very pleasant 59 y.o. male in no acute distress. He appears well-developed and well-nourished and well groomed.   HEENT: Normocephalic, atraumatic, pupils are equal, round and reactive to light, extraocular tracking is good without limitation to gaze excursion or nystagmus noted. no photophobia.  No Corrective eye glasses in place. Hearing is grossly intact.  Face is symmetric with normal facial animation. Speech is clear without dysarthria. There is no hypophonia. There is no lip, neck/head, jaw or voice tremor. Neck is supple with full range of  passive and active motion. There are no carotid bruits on auscultation.  Airway/Oropharynx exam reveals: mild mouth dryness, good dental hygiene and moderate airway crowding, due to small airway entry, Mallampati class II, wider uvula noted.  Tonsils absent.  Minimal overbite.  Tongue protrudes centrally and palate elevates symmetrically.  Chest: Clear to auscultation without wheezing, rhonchi or crackles noted.  Heart: S1+S2+0, regular and normal without murmurs, rubs or gallops noted.   Abdomen: Soft, non-tender and non-distended.  Extremities: There is no pitting edema in the distal lower extremities bilaterally.   Skin: Warm and dry without trophic changes noted.   Musculoskeletal: exam reveals no obvious joint deformities.   Neurologically:  Mental status: The patient is awake, alert and oriented in all 4 spheres. His immediate and remote memory, attention, language skills and fund of knowledge are appropriate. There is no evidence of aphasia, agnosia, apraxia or anomia. Speech is clear with normal prosody and enunciation. Thought process is linear. Mood is normal and affect is normal.  Cranial nerves II - XII are as described above under HEENT exam.  Motor exam: Normal bulk, strength and tone is noted. There is no obvious action or resting tremor.  Fine motor skills and coordination: Intact  grossly.  Cerebellar testing: No dysmetria or intention tremor. There is no truncal or gait ataxia.  Sensory exam: intact to light touch in the upper and lower extremities.  Gait, station and balance: He stands easily. No veering to one side is noted. No leaning to one side is noted. Posture is age-appropriate and stance is narrow based. Gait shows normal stride length and normal pace. No problems turning are noted.   Assessment and plan:   In summary, Randall Cortez is a very pleasant 59 year old male with an underlying medical history of asthma, bipolar disorder, stroke, reflux disease, anxiety, depression, paresthesia, peptic ulcer disease, prediabetes, and obesity, whose history and physical exam are concerning for sleep disordered breathing, particularly obstructive sleep apnea (OSA). A laboratory attended sleep study is typically considered gold standard for evaluation of sleep disordered breathing.   I had a long chat with the patient about my findings and the diagnosis of sleep apnea, particularly OSA, its prognosis and treatment options. We talked about medical/conservative treatments, surgical interventions and non-pharmacological approaches for symptom control. I explained, in particular, the risks and ramifications of untreated moderate to severe OSA, especially with respect to developing cardiovascular disease down the road, including congestive heart failure (CHF), difficult to treat hypertension, cardiac arrhythmias (particularly A-fib), neurovascular complications including TIA, stroke and dementia. Even type 2 diabetes has, in part, been linked to untreated OSA. Symptoms of untreated OSA may include (but may not be limited to) daytime sleepiness, nocturia (i.e. frequent nighttime urination), memory problems, mood irritability and suboptimally controlled or worsening mood disorder such as depression and/or anxiety, lack of energy, lack of motivation, physical discomfort, as well as  recurrent headaches, especially morning or nocturnal headaches. We talked about the importance of maintaining a healthy lifestyle and striving for healthy weight.  I recommended a sleep study at this time. I outlined the differences between a laboratory attended sleep study which is considered more comprehensive and accurate over the option of a home sleep test (HST); the latter may lead to underestimation of sleep disordered breathing in some instances and does not help with diagnosing upper airway resistance syndrome and is not accurate enough to diagnose primary central sleep apnea typically. I outlined possible surgical and non-surgical treatment options of OSA, including the use  of a positive airway pressure (PAP) device (i.e. CPAP, AutoPAP/APAP or BiPAP in certain circumstances), a custom-made dental device (aka oral appliance, which would require a referral to a specialist dentist or orthodontist typically, and is generally speaking not considered for patients with full dentures or edentulous state), upper airway surgical options, such as traditional UPPP (which is not considered a first-line treatment) or the Inspire device (hypoglossal nerve stimulator, which would involve a referral for consultation with an ENT surgeon, after careful selection, following inclusion criteria - also not first-line treatment). I explained the PAP treatment option to the patient in detail, as this is generally considered first-line treatment.  The patient indicated that he would be willing to try PAP therapy, if the need arises. I explained the importance of being compliant with PAP treatment, not only for insurance purposes but primarily to improve patient's symptoms symptoms, and for the patient's long term health benefit, including to reduce His cardiovascular risks longer-term.    We will pick up our discussion about the next steps and treatment options after testing.  We will keep him posted as to the test results by  phone call and/or MyChart messaging where possible.  We will plan to follow-up in sleep clinic accordingly as well.  I answered all his questions today and the patient was in agreement.   I encouraged him to call with any interim questions, concerns, problems or updates or email us  through MyChart.  Generally speaking, sleep test authorizations may take up to 2 weeks, sometimes less, sometimes longer, the patient is encouraged to get in touch with us  if they do not hear back from the sleep lab staff directly within the next 2 weeks.  Thank you very much for allowing me to participate in the care of this nice patient. If I can be of any further assistance to you please do not hesitate to call me at [phone].  Sincerely,   True Mar, MD, PhD     [1] No Known Allergies

## 2024-06-29 ENCOUNTER — Telehealth: Payer: Self-pay | Admitting: Neurology

## 2024-06-29 NOTE — Telephone Encounter (Signed)
 [facility] [employer] pending
